# Patient Record
Sex: Male | Born: 1945 | Race: White | Hispanic: No | Marital: Married | Smoking: Former smoker
Health system: Southern US, Community
[De-identification: ages and names within clinical notes are randomized; demographics above are authoritative.]

## PROBLEM LIST (undated history)

## (undated) DIAGNOSIS — C801 Malignant (primary) neoplasm, unspecified: Secondary | ICD-10-CM

## (undated) DIAGNOSIS — E785 Hyperlipidemia, unspecified: Secondary | ICD-10-CM

## (undated) DIAGNOSIS — I639 Cerebral infarction, unspecified: Secondary | ICD-10-CM

## (undated) DIAGNOSIS — Z86010 Personal history of colonic polyps: Secondary | ICD-10-CM

## (undated) HISTORY — DX: Personal history of colonic polyps: Z86.010

## (undated) HISTORY — DX: Cerebral infarction, unspecified: I63.9

## (undated) HISTORY — DX: Hyperlipidemia, unspecified: E78.5

## (undated) HISTORY — PX: ROTATOR CUFF REPAIR: SHX139

## (undated) HISTORY — PX: PROSTATE BIOPSY: SHX241

## (undated) HISTORY — PX: TONSILLECTOMY: SUR1361

---

## 1983-01-06 HISTORY — PX: HERNIA REPAIR: SHX51

## 1995-01-06 HISTORY — PX: APPENDECTOMY: SHX54

## 1995-11-29 ENCOUNTER — Encounter: Payer: Self-pay | Admitting: Internal Medicine

## 2001-01-05 HISTORY — PX: COLONOSCOPY: SHX174

## 2001-03-22 ENCOUNTER — Encounter: Payer: Self-pay | Admitting: Orthopedic Surgery

## 2001-03-22 ENCOUNTER — Ambulatory Visit (HOSPITAL_COMMUNITY): Admission: RE | Admit: 2001-03-22 | Discharge: 2001-03-22 | Payer: Self-pay | Admitting: Orthopedic Surgery

## 2001-09-01 ENCOUNTER — Encounter: Payer: Self-pay | Admitting: Internal Medicine

## 2003-06-22 ENCOUNTER — Encounter: Payer: Self-pay | Admitting: Internal Medicine

## 2003-11-13 ENCOUNTER — Ambulatory Visit: Payer: Self-pay | Admitting: Internal Medicine

## 2004-02-13 ENCOUNTER — Ambulatory Visit: Payer: Self-pay | Admitting: Internal Medicine

## 2004-10-24 ENCOUNTER — Ambulatory Visit: Payer: Self-pay | Admitting: Internal Medicine

## 2005-06-25 ENCOUNTER — Ambulatory Visit: Payer: Self-pay | Admitting: Internal Medicine

## 2005-10-21 ENCOUNTER — Ambulatory Visit: Payer: Self-pay | Admitting: Internal Medicine

## 2005-10-21 LAB — CONVERTED CEMR LAB
ALT: 31 units/L (ref 0–40)
AST: 26 units/L (ref 0–37)
Albumin: 4.2 g/dL (ref 3.5–5.2)
Alkaline Phosphatase: 75 units/L (ref 39–117)
BUN: 17 mg/dL (ref 6–23)
Basophils Absolute: 0 10*3/uL (ref 0.0–0.1)
Basophils Relative: 0.5 % (ref 0.0–1.0)
CO2: 26 meq/L (ref 19–32)
Calcium: 9.6 mg/dL (ref 8.4–10.5)
Chloride: 105 meq/L (ref 96–112)
Chol/HDL Ratio, serum: 5.9
Cholesterol: 260 mg/dL (ref 0–200)
Creatinine, Ser: 1.2 mg/dL (ref 0.4–1.5)
Eosinophil percent: 1.8 % (ref 0.0–5.0)
GFR calc non Af Amer: 65 mL/min
Glomerular Filtration Rate, Af Am: 79 mL/min/{1.73_m2}
Glucose, Bld: 98 mg/dL (ref 70–99)
HCT: 50.9 % (ref 39.0–52.0)
HDL: 44 mg/dL (ref >39.0)
Hemoglobin: 16.9 g/dL (ref 13.0–17.0)
LDL DIRECT: 188.3 mg/dL
Lymphocytes Relative: 27.9 % (ref 12.0–46.0)
MCHC: 33.2 g/dL (ref 30.0–36.0)
MCV: 95 fL (ref 78.0–100.0)
Monocytes Absolute: 0.4 10*3/uL (ref 0.2–0.7)
Monocytes Relative: 7 % (ref 3.0–11.0)
Neutro Abs: 3.5 10*3/uL (ref 1.4–7.7)
Neutrophils Relative %: 62.8 % (ref 43.0–77.0)
PSA: 1.28 ng/mL (ref 0.10–4.00)
Platelets: 231 10*3/uL (ref 150–400)
Potassium: 4 meq/L (ref 3.5–5.1)
RBC: 5.36 M/uL (ref 4.22–5.81)
RDW: 12.2 % (ref 11.5–14.6)
Sodium: 140 meq/L (ref 135–145)
TSH: 2.06 microintl units/mL (ref 0.35–5.50)
Total Bilirubin: 0.9 mg/dL (ref 0.3–1.2)
Total Protein: 7 g/dL (ref 6.0–8.3)
Triglyceride fasting, serum: 106 mg/dL (ref 0–149)
VLDL: 21 mg/dL (ref 0–40)
WBC: 5.6 10*3/uL (ref 4.5–10.5)

## 2006-01-19 ENCOUNTER — Ambulatory Visit: Payer: Self-pay | Admitting: Sports Medicine

## 2006-03-02 ENCOUNTER — Ambulatory Visit: Payer: Self-pay | Admitting: Sports Medicine

## 2006-04-02 ENCOUNTER — Ambulatory Visit: Payer: Self-pay | Admitting: Internal Medicine

## 2006-04-02 LAB — CONVERTED CEMR LAB
ALT: 41 U/L — ABNORMAL HIGH (ref 0–40)
AST: 32 U/L (ref 0–37)
Cholesterol: 129 mg/dL (ref 0–200)
HDL: 41.7 mg/dL (ref >39.0)
LDL Cholesterol: 68 mg/dL (ref 0–99)
Total CHOL/HDL Ratio: 3.1
Triglycerides: 99 mg/dL (ref 0–149)
VLDL: 20 mg/dL (ref 0–40)

## 2006-05-06 DIAGNOSIS — Z9089 Acquired absence of other organs: Secondary | ICD-10-CM | POA: Insufficient documentation

## 2006-05-06 DIAGNOSIS — E785 Hyperlipidemia, unspecified: Secondary | ICD-10-CM | POA: Insufficient documentation

## 2006-05-07 ENCOUNTER — Ambulatory Visit: Payer: Self-pay | Admitting: Internal Medicine

## 2006-09-21 ENCOUNTER — Encounter: Payer: Self-pay | Admitting: Internal Medicine

## 2006-10-29 ENCOUNTER — Ambulatory Visit: Payer: Self-pay | Admitting: Internal Medicine

## 2006-11-09 ENCOUNTER — Encounter (INDEPENDENT_AMBULATORY_CARE_PROVIDER_SITE_OTHER): Payer: Self-pay | Admitting: *Deleted

## 2006-11-09 LAB — CONVERTED CEMR LAB
ALT: 25 units/L (ref 0–53)
AST: 23 units/L (ref 0–37)
Cholesterol: 177 mg/dL (ref 0–200)
HDL: 48.8 mg/dL (ref >39.0)
Hgb A1c MFr Bld: 5.2 % (ref 4.6–6.0)
LDL Cholesterol: 105 mg/dL — ABNORMAL HIGH (ref 0–99)
Total CHOL/HDL Ratio: 3.6
Triglycerides: 117 mg/dL (ref 0–149)
VLDL: 23 mg/dL (ref 0–40)

## 2006-11-25 ENCOUNTER — Ambulatory Visit: Payer: Self-pay | Admitting: Internal Medicine

## 2007-01-18 ENCOUNTER — Ambulatory Visit: Payer: Self-pay | Admitting: Internal Medicine

## 2007-04-12 ENCOUNTER — Telehealth (INDEPENDENT_AMBULATORY_CARE_PROVIDER_SITE_OTHER): Payer: Self-pay | Admitting: *Deleted

## 2007-04-18 ENCOUNTER — Encounter: Payer: Self-pay | Admitting: Internal Medicine

## 2007-09-16 ENCOUNTER — Ambulatory Visit: Payer: Self-pay | Admitting: Internal Medicine

## 2007-09-19 ENCOUNTER — Encounter (INDEPENDENT_AMBULATORY_CARE_PROVIDER_SITE_OTHER): Payer: Self-pay | Admitting: *Deleted

## 2008-01-18 ENCOUNTER — Telehealth (INDEPENDENT_AMBULATORY_CARE_PROVIDER_SITE_OTHER): Payer: Self-pay | Admitting: *Deleted

## 2008-02-24 ENCOUNTER — Telehealth (INDEPENDENT_AMBULATORY_CARE_PROVIDER_SITE_OTHER): Payer: Self-pay | Admitting: *Deleted

## 2008-11-05 ENCOUNTER — Encounter: Payer: Self-pay | Admitting: Internal Medicine

## 2008-12-05 ENCOUNTER — Telehealth (INDEPENDENT_AMBULATORY_CARE_PROVIDER_SITE_OTHER): Payer: Self-pay | Admitting: *Deleted

## 2008-12-05 ENCOUNTER — Ambulatory Visit: Payer: Self-pay | Admitting: Internal Medicine

## 2008-12-06 ENCOUNTER — Ambulatory Visit: Payer: Self-pay | Admitting: Internal Medicine

## 2008-12-07 ENCOUNTER — Telehealth: Payer: Self-pay | Admitting: Internal Medicine

## 2008-12-09 LAB — CONVERTED CEMR LAB
ALT: 24 units/L (ref 0–53)
AST: 24 units/L (ref 0–37)
Albumin: 4 g/dL (ref 3.5–5.2)
Alkaline Phosphatase: 91 units/L (ref 39–117)
BUN: 18 mg/dL (ref 6–23)
Basophils Absolute: 0.1 10*3/uL (ref 0.0–0.1)
Basophils Relative: 0.9 % (ref 0.0–3.0)
Bilirubin, Direct: 0.1 mg/dL (ref 0.0–0.3)
CO2: 27 meq/L (ref 19–32)
Calcium: 9.1 mg/dL (ref 8.4–10.5)
Chloride: 107 meq/L (ref 96–112)
Cholesterol: 164 mg/dL (ref 0–200)
Creatinine, Ser: 1.1 mg/dL (ref 0.4–1.5)
Eosinophils Absolute: 0.1 10*3/uL (ref 0.0–0.7)
Eosinophils Relative: 1.9 % (ref 0.0–5.0)
GFR calc non Af Amer: 71.23 mL/min (ref >60)
Glucose, Bld: 97 mg/dL (ref 70–99)
HCT: 46.2 % (ref 39.0–52.0)
HDL: 46.3 mg/dL (ref >39.00)
Hemoglobin: 16.1 g/dL (ref 13.0–17.0)
LDL Cholesterol: 100 mg/dL — ABNORMAL HIGH (ref 0–99)
Lymphocytes Relative: 27.2 % (ref 12.0–46.0)
Lymphs Abs: 1.7 10*3/uL (ref 0.7–4.0)
MCHC: 34.8 g/dL (ref 30.0–36.0)
MCV: 96.8 fL (ref 78.0–100.0)
Monocytes Absolute: 0.4 10*3/uL (ref 0.1–1.0)
Monocytes Relative: 6.4 % (ref 3.0–12.0)
Neutro Abs: 4.1 10*3/uL (ref 1.4–7.7)
Neutrophils Relative %: 63.6 % (ref 43.0–77.0)
PSA: 1.42 ng/mL (ref 0.10–4.00)
Platelets: 174 10*3/uL (ref 150.0–400.0)
Potassium: 4.6 meq/L (ref 3.5–5.1)
RBC: 4.77 M/uL (ref 4.22–5.81)
RDW: 11.8 % (ref 11.5–14.6)
Sodium: 140 meq/L (ref 135–145)
TSH: 1.87 microintl units/mL (ref 0.35–5.50)
Total Bilirubin: 0.7 mg/dL (ref 0.3–1.2)
Total CHOL/HDL Ratio: 4
Total Protein: 6.8 g/dL (ref 6.0–8.3)
Triglycerides: 90 mg/dL (ref 0.0–149.0)
VLDL: 18 mg/dL (ref 0.0–40.0)
WBC: 6.4 10*3/uL (ref 4.5–10.5)

## 2008-12-10 ENCOUNTER — Encounter (INDEPENDENT_AMBULATORY_CARE_PROVIDER_SITE_OTHER): Payer: Self-pay | Admitting: *Deleted

## 2009-04-10 ENCOUNTER — Ambulatory Visit: Payer: Self-pay | Admitting: Internal Medicine

## 2009-04-11 ENCOUNTER — Emergency Department (HOSPITAL_COMMUNITY): Admission: EM | Admit: 2009-04-11 | Discharge: 2009-04-11 | Payer: Self-pay | Admitting: Emergency Medicine

## 2009-04-11 ENCOUNTER — Telehealth: Payer: Self-pay | Admitting: Internal Medicine

## 2009-04-11 ENCOUNTER — Ambulatory Visit: Payer: Self-pay | Admitting: Internal Medicine

## 2009-04-12 ENCOUNTER — Encounter: Payer: Self-pay | Admitting: Internal Medicine

## 2009-04-12 ENCOUNTER — Encounter: Admission: RE | Admit: 2009-04-12 | Discharge: 2009-04-12 | Payer: Self-pay | Admitting: Internal Medicine

## 2009-04-12 LAB — CONVERTED CEMR LAB
ALT: 24 units/L (ref 0–53)
AST: 23 units/L (ref 0–37)
Albumin: 4.1 g/dL (ref 3.5–5.2)
Alkaline Phosphatase: 73 units/L (ref 39–117)
Amylase: 65 units/L (ref 27–131)
Basophils Absolute: 0 10*3/uL (ref 0.0–0.1)
Basophils Relative: 0.5 % (ref 0.0–3.0)
Bilirubin, Direct: 0.1 mg/dL (ref 0.0–0.3)
Eosinophils Absolute: 0.1 10*3/uL (ref 0.0–0.7)
Eosinophils Relative: 1.4 % (ref 0.0–5.0)
HCT: 47 % (ref 39.0–52.0)
Hemoglobin: 16.1 g/dL (ref 13.0–17.0)
Lipase: 20 units/L (ref 11.0–59.0)
Lymphocytes Relative: 26.3 % (ref 12.0–46.0)
Lymphs Abs: 2.3 10*3/uL (ref 0.7–4.0)
MCHC: 34.2 g/dL (ref 30.0–36.0)
MCV: 96.1 fL (ref 78.0–100.0)
Monocytes Absolute: 0.7 10*3/uL (ref 0.1–1.0)
Monocytes Relative: 7.6 % (ref 3.0–12.0)
Neutro Abs: 5.7 10*3/uL (ref 1.4–7.7)
Neutrophils Relative %: 64.2 % (ref 43.0–77.0)
Platelets: 196 10*3/uL (ref 150.0–400.0)
RBC: 4.9 M/uL (ref 4.22–5.81)
RDW: 13.1 % (ref 11.5–14.6)
Total Bilirubin: 0.9 mg/dL (ref 0.3–1.2)
Total Protein: 7.2 g/dL (ref 6.0–8.3)
WBC: 8.8 10*3/uL (ref 4.5–10.5)

## 2009-04-15 ENCOUNTER — Telehealth (INDEPENDENT_AMBULATORY_CARE_PROVIDER_SITE_OTHER): Payer: Self-pay | Admitting: *Deleted

## 2009-04-15 ENCOUNTER — Encounter: Payer: Self-pay | Admitting: Internal Medicine

## 2009-04-16 ENCOUNTER — Telehealth: Payer: Self-pay | Admitting: Internal Medicine

## 2009-04-30 ENCOUNTER — Ambulatory Visit: Payer: Self-pay | Admitting: Internal Medicine

## 2009-04-30 LAB — CONVERTED CEMR LAB
OCCULT 1: NEGATIVE
OCCULT 2: NEGATIVE
OCCULT 3: NEGATIVE

## 2009-05-01 ENCOUNTER — Encounter (INDEPENDENT_AMBULATORY_CARE_PROVIDER_SITE_OTHER): Payer: Self-pay | Admitting: *Deleted

## 2009-05-20 ENCOUNTER — Ambulatory Visit: Payer: Self-pay | Admitting: Internal Medicine

## 2010-01-31 ENCOUNTER — Ambulatory Visit
Admission: RE | Admit: 2010-01-31 | Discharge: 2010-01-31 | Payer: Self-pay | Source: Home / Self Care | Attending: Internal Medicine | Admitting: Internal Medicine

## 2010-01-31 ENCOUNTER — Encounter: Payer: Self-pay | Admitting: Internal Medicine

## 2010-02-02 LAB — CONVERTED CEMR LAB
ALT: 26 units/L (ref 0–53)
AST: 25 units/L (ref 0–37)
Albumin: 4.5 g/dL (ref 3.5–5.2)
Alkaline Phosphatase: 77 units/L (ref 39–117)
BUN: 21 mg/dL (ref 6–23)
Basophils Absolute: 0 10*3/uL (ref 0.0–0.1)
Basophils Relative: 0.4 % (ref 0.0–3.0)
Bilirubin, Direct: 0.1 mg/dL (ref 0.0–0.3)
CO2: 28 meq/L (ref 19–32)
Calcium: 9.4 mg/dL (ref 8.4–10.5)
Chloride: 107 meq/L (ref 96–112)
Cholesterol, target level: 200 mg/dL
Cholesterol: 214 mg/dL (ref 0–200)
Creatinine, Ser: 1.1 mg/dL (ref 0.4–1.5)
Direct LDL: 160.9 mg/dL
Eosinophils Absolute: 0.1 10*3/uL (ref 0.0–0.7)
Eosinophils Relative: 1 % (ref 0.0–5.0)
GFR calc Af Amer: 87 mL/min
GFR calc non Af Amer: 72 mL/min
Glucose, Bld: 93 mg/dL (ref 70–99)
HCT: 48.9 % (ref 39.0–52.0)
HDL goal, serum: 40 mg/dL
HDL: 36.8 mg/dL — ABNORMAL LOW (ref >39.0)
Hemoglobin: 17.3 g/dL — ABNORMAL HIGH (ref 13.0–17.0)
LDL Goal: 120 mg/dL
Lymphocytes Relative: 22.2 % (ref 12.0–46.0)
MCHC: 35.3 g/dL (ref 30.0–36.0)
MCV: 95.5 fL (ref 78.0–100.0)
Monocytes Absolute: 0.5 10*3/uL (ref 0.1–1.0)
Monocytes Relative: 6.2 % (ref 3.0–12.0)
Neutro Abs: 5.5 10*3/uL (ref 1.4–7.7)
Neutrophils Relative %: 70.2 % (ref 43.0–77.0)
PSA: 1.36 ng/mL (ref 0.10–4.00)
Platelets: 218 10*3/uL (ref 150–400)
Potassium: 4.6 meq/L (ref 3.5–5.1)
RBC: 5.12 M/uL (ref 4.22–5.81)
RDW: 12 % (ref 11.5–14.6)
Sodium: 141 meq/L (ref 135–145)
TSH: 1.97 microintl units/mL (ref 0.35–5.50)
Total Bilirubin: 1 mg/dL (ref 0.3–1.2)
Total CHOL/HDL Ratio: 5.8
Total Protein: 8 g/dL (ref 6.0–8.3)
Triglycerides: 137 mg/dL (ref 0–149)
VLDL: 27 mg/dL (ref 0–40)
WBC: 7.8 10*3/uL (ref 4.5–10.5)

## 2010-02-04 NOTE — Progress Notes (Signed)
Summary: Triage   Phone Note From Other Clinic   Caller: Renee @ [facility] [phone] Call For: Dr. Leone Payor Summary of Call: pt is having upper right quadrant pain.  Had HIDA Scan and abd. ultrasound that was normal. Would like to be seen before next avail. appt.  Patient will leave this Thursday and will return next Wed Initial call taken by: Karna Christmas,  April 16, 2009 3:47 PM  Follow-up for Phone Call        I spoke with GJ they will call me back after they get more symptom update from the patient  Darcey Nora RN, [facility]  April 17, 2009 9:31 AM  05/20/09 9:30 with Dr Leone Payor I have left a message for GJ to call me back. Darcey Nora RN, [facility]  April 17, 2009 9:59 AM  Additional Follow-up for Phone Call Additional follow up Details #1::        Stacia aware, she will notify the pt Additional Follow-up by: Darcey Nora RN, CGRN,  April 17, 2009 10:18 AM     Appended Document: Triage Spoke with patient's wife, she is aware of appointment date/time with Dr.Gessner

## 2010-02-04 NOTE — Letter (Signed)
Summary: Results Follow up Letter  [name] [place]  [address] [place], [place] [address]   Phone: [phone]  Fax: [phone]    05/01/2009 MRN: [id number]  David Allen [address] [place], [place]  [address]  Dear [name],  The following are the results of your recent test(s):  Test         Result    Pap Smear:        Normal _____  Not Normal _____ Comments: ______________________________________________________ Cholesterol: LDL(Bad cholesterol):         Your goal is less than:         HDL (Good cholesterol):       Your goal is more than: Comments:  ______________________________________________________ Mammogram:        Normal _____  Not Normal _____ Comments:  ___________________________________________________________________ Hemoccult:        Normal _X____  Not normal _______ Comments:    _____________________________________________________________________ Other Tests:    We routinely do not discuss normal results over the telephone.  If you desire a copy of the results, or you have any questions about this information we can discuss them at your next office visit.   Sincerely,

## 2010-02-04 NOTE — Progress Notes (Signed)
Summary: Request for HIDA Scan Results  Phone Note Outgoing Call   Call placed by: Shonna Chock,  April 15, 2009 5:17 PM Call placed to: Patient Summary of Call: I called the number for [facility] [phone] (No Answer) to request results per patient request in Email sent to Dr.Hopper  [facility]  April 15, 2009 5:17 PM   Follow-up for Phone Call        I called [facility] again and they said the report is avaliable and they will fax that to [place].   Report will be placed on ledge for Dr.Hopper to review. Follow-up by: Shonna Chock,  April 16, 2009 10:54 AM  Additional Follow-up for Phone Call Additional follow up Details #1::        Papida scan ( assesses gall bladder function) is normal. GI consultation indicated Additional Follow-up by: Shonna Chock,  April 16, 2009 11:24 AM    Additional Follow-up for Phone Call Additional follow up Details #2::    Spoke with patient's wife and she is aware of GI referral.  Patient will leave this Thursday and will return next Wed. Please take this in consdieration when setting up referral.  Patient's wife has several question about the scan and would like for Dr.Hopper to call to discuss. Shonna Chock,  April 16, 2009 11:25 AM  I have placed a triage call to [facility], awaiting call back regarding patient's appointment. Magdalen Spatz [facility]  April 16, 2009 3:53 PM  Patient was given appointment w/Dr. Leone Payor on 05-20-2009 @ 9:30am.  See [facility] phone note dated 04/16/2009.  Patient aware of appt.  Follow-up by: Magdalen Spatz [facility],  April 17, 2009 3:45 PM  Additional Follow-up for Phone Call Additional follow up Details #3:: Details for Additional Follow-up Action Taken: please send stool cards to his home   Stool Cards Mailed./[facility]  April 17, 2009 4:49 PM  Additional Follow-up by: Marga Melnick MD,  April 17, 2009 4:19 PM

## 2010-02-04 NOTE — Procedures (Signed)
Summary: Colonoscopy: Hemorrhoids   Colonoscopy  Procedure date:  09/01/2001  Findings:      Results: Hemorrhoids.     Location:  [facility].    Procedures Next Due Date:    Colonoscopy: 09/2011  Patient Name: David Allen, David Allen MRN:  Procedure Procedures: Colonoscopy CPT: 16109.  Personnel: Endoscopist: Iva Boop, MD, [facility].  Referred By: Titus Dubin. Alwyn Ren, MD.  Exam Location: Exam performed in Outpatient Clinic. Outpatient  Patient Consent: Procedure, Alternatives, Risks and Benefits discussed, consent obtained, from patient. Consent was obtained by the RN.  Indications  Average Risk Screening Routine.  History  Pre-Exam Physical: Performed Sep 01, 2001. Cardio-pulmonary exam, Rectal exam, HEENT exam , Mental status exam WNL.  Exam Exam: Extent of exam reached: Cecum, extent intended: Cecum.  The cecum was identified by appendiceal orifice and IC valve. Patient position: on left side. Colon retroflexion performed. Images taken. ASA Classification: I. Tolerance: excellent.  Monitoring: Pulse and BP monitoring, Oximetry used. Supplemental O2 given.  Colon Prep Used Visicol for colon prep. Prep results: excellent.  Sedation Meds: Patient assessed and found to be appropriate for moderate (conscious) sedation. Fentanyl 100 mcg. given IV. Versed 7 mg. given IV.  Findings - NORMAL EXAM: Cecum to Sigmoid Colon.  NORMAL EXAM: Cecum.  HEMORRHOIDS: External. Size: Grade I. Not bleeding. Not thrombosed. ICD9: Hemorrhoids, External: 455.3.   Assessment Abnormal examination, see findings above.  Diagnoses: 455.3: Hemorrhoids, External.   Events  Unplanned Interventions: No intervention was required.  Plans Patient Education: Patient given standard instructions for: Hemorrhoids.  Disposition: After procedure patient sent to recovery. After recovery patient sent home.  Comments: Recommend starting screening hemoccults in 5 years (through  Dr. Alwyn Ren) and routine colonoscopy in 10 years (2013).  CC:   Marga Melnick, MD  This report was created from the original endoscopy report, which was reviewed and signed by the above listed endoscopist.

## 2010-02-04 NOTE — Miscellaneous (Signed)
Summary: Orders Update   Clinical Lists Changes  Orders: Added new Referral order of Radiology Referral (Radiology) - Signed 

## 2010-02-04 NOTE — Assessment & Plan Note (Signed)
Summary: abdominal pain/cbs per chrae   Vital Signs:  Patient profile:   65 year old male Weight:      212.2 pounds Temp:     98.9 degrees F oral Pulse rate:   80 / minute Resp:     17 per minute BP sitting:   148 / 80  (left arm) Cuff size:   large  Vitals Entered By: Shonna Chock (April 10, 2009 4:37 PM) CC: Right side stomach pain since 1 pm, (appendix removed), Abdominal pain Comments REVIEWED MED LIST, PATIENT AGREED DOSE AND INSTRUCTION CORRECT    CC:  Right side stomach pain since 1 pm, (appendix removed), and Abdominal pain.  History of Present Illness:  Abdominal Pain      This is a 65 year old man who presents with acute abdominal pain immediately after eating egg plant parmesan today @ 1:30 pm .  The patient reports nausea and vomiting which he "forced"after taking TUMS.He  denies diarrhea, constipation, melena, hematochezia, anorexia, and hematemesis.  The location of the pain is right upper quadrant.  The pain is described as constant and sharp w/o radiation.It has decreased from 9 to a 5 presently.  The patient denies the following symptoms: fever, dysuria, chest pain, jaundice, and dark urine.  The pain was  worse with food.  The pain was no  better with antacids; standing & walking helped some. PMH of appendectomy.  Allergies (verified): No Known Drug Allergies  Past History:  Past Surgical History: Reviewed history from 12/05/2008 and no changes required. Appendectomy 1997 ([place]) Colonoscopy 2003: neg (due 2013), [facility] Rotator cuff repair,Dr Sypher Tonsillectomy  Review of Systems General:  Denies chills and sweats. ENT:  Denies difficulty swallowing and hoarseness. CV:  No exertional chewst pain. GI:  Denies indigestion. GU:  Denies discharge and hematuria.  Physical Exam  General:  well-nourished,in no acute distress; alert,appropriate and cooperative throughout examination Eyes:  No corneal or conjunctival inflammation noted. Perrla.No  icterus Mouth:  Oral mucosa and oropharynx without lesions or exudates.  Teeth in good repair. no pharyngeal erythema.  Tongue moist Lungs:  Normal respiratory effort, chest expands symmetrically. Lungs are clear to auscultation, no crackles or wheezes. Heart:  Normal rate and regular rhythm. S1 and S2 normal without gallop, murmur, click, rub .S4 Abdomen:  Bowel sounds positive but decreased w/o clinical ileus.Abdomen is  soft and non-tender  even to percussion ,without masses, organomegaly .Ventral hernia Skin:  Intact without suspicious lesions or rashes. No jaundice Cervical Nodes:  No lymphadenopathy noted Axillary Nodes:  No palpable lymphadenopathy Psych:  memory intact for recent and remote, normally interactive, and good eye contact.     Impression & Recommendations:  Problem # 1:  ABDOMINAL PAIN, RIGHT UPPER QUADRANT (ICD-789.01)  R/O GB disease  Orders: Radiology Referral (Radiology)  Complete Medication List: 1)  Crestor 10 Mg Tabs (Rosuvastatin calcium) .... Take 1 tab once daily at bedtime 2)  Tramadol Hcl 50 Mg Tabs (Tramadol hcl) .Marland Kitchen.. 1 every 6 hrs as needed for pain  Patient Instructions: 1)  Nothing by mouth except ice chips  & meds.To ER if pain recurs & progresses.Schedule fasting labs in am: 2)  Hepatic Panel ;amylase;lipase; 3)  CBC w/ Diff . Hold Crestor until well. Prescriptions: TRAMADOL HCL 50 MG TABS (TRAMADOL HCL) 1 every 6 hrs as needed for pain  #30 x 1   Entered and Authorized by:   Marga Melnick MD   Signed by:   Marga Melnick MD on  04/10/2009   Method used:   Faxed to ...       [facility] [address] [phone]* (retail)       [address]       [place], [place]  [address]       Ph: [phone] or [phone]       Fax: [phone]   RxID:   [id number]

## 2010-02-04 NOTE — Assessment & Plan Note (Signed)
Summary: ruq abdominal pain/sheri    History of Present Illness Visit Type: consult Primary GI MD: Stan Head MD [facility] Primary Provider: Oren Bracket Requesting Provider: Oren Bracket Chief Complaint: RUQ Pain History of Present Illness:   65 yo wm with sudden onset of RUQ pain in Apri Dr. Alwyn Ren thought it sounded like  gallbladder pain.  Pt stated Epigastric pain in the center, pt states he is in no pain now, only here to discuss HIDA scan done at [facility] Only had two days of pain.It started immediately after eggplant parmasean. Lasted two days, persistent then spontaneously resolved. He saw Dr. Alwyn Ren, the ED and had labs and xrays. Pain was fairly constant and in epigastrium he tells me.  Dr. Frederik Pear hx of 04/10/2009....Marland Kitchen  Abdominal Pain      This is a 65 year old man who presents with acute abdominal pain immediately after eating egg plant parmesan today @ 1:30 pm .  The patient reports nausea and vomiting which he "forced"after taking TUMS.He  denies diarrhea, constipation, melena, hematochezia, anorexia, and hematemesis.  The location of the pain is right upper quadrant.  The pain is described as constant and sharp w/o radiation.It has decreased from 9 to a 5 presently.  The patient denies the following symptoms: fever, dysuria, chest pain, jaundice, and dark urine.  The pain was  worse with food.  The pain was no  better with antacids; standing & walking helped some. PMH of appendectomy.   GI Review of Systems    Reports abdominal pain.     Location of  Abdominal pain: RUQ.    Denies acid reflux, belching, bloating, chest pain, dysphagia with liquids, dysphagia with solids, heartburn, loss of appetite, nausea, vomiting, vomiting blood, weight loss, and  weight gain.        Denies anal fissure, black tarry stools, change in bowel habit, constipation, diarrhea, diverticulosis, fecal incontinence, heme positive stool, hemorrhoids, irritable bowel syndrome,  jaundice, light color stool, liver problems, rectal bleeding, and  rectal pain.    [place] of Abdomen  Procedure date:  04/12/2009  Findings:      normal:    HIDA Scan  Procedure date:  04/15/2009  Findings:      Normal uptake and excretion ejection fraction 41% 'low normal'   Current Medications (verified): 1)  Crestor 10 Mg Tabs (Rosuvastatin Calcium) .... Take 1 Tab Once Daily At Bedtime 2)  Aspirin 81 Mg Tbec (Aspirin) .Marland Kitchen.. 1 By Mouth Once Daily  Allergies (verified): No Known Drug Allergies  Past History:  Past Medical History: Reviewed history from 05/17/2009 and no changes required. Hyperlipidemia; Elevated hepatic enzymes ? 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 Father:lung CA  Mother: Dementia Siblings: bro neg; MGM Dementia; cousin double mastectomy  Social History: Occupation:Stock Teaching laboratory technician Married Alcohol use-yes:socially Regular exercise-yes: aerobic, toning , tennis Former Smoker: smoked 1970-79  Review of Systems       no chest pain, DOE exercises vigorously several times a week no fevers  Vital Signs:  Patient profile:   65 year old male Height:      69.5 inches Weight:      212 pounds BMI:     30.97 BSA:     2.13 Pulse rate:   88 / minute  Pulse rhythm:   regular BP sitting:   142 / 78  (left arm)  Vitals Entered By: Merri Ray CMA Duncan Dull) (May 20, 2009 9:20 AM)  Physical Exam  General:  Well developed, well nourished, no acute distress. Overweight Lungs:  Clear throughout to auscultation. Heart:  Regular rate and rhythm; no murmurs, rubs,  or bruits. Abdomen:  Bowel sounds positive but decreased w/o clinical ileus.Abdomen is  soft and non-tender  even to  percussion ,without masses, organomegaly .Ventral hernia overweight Psych:  Alert and cooperative. Normal mood and affect.   Impression & Recommendations:  Problem # 1:  ABDOMINAL PAIN, UPPER (ICD-789.09) Assessment Comment Only New for GI evaluation orig RUQ,  now described as  epigastric occurred acutely after a meal (eggplant) it is resolved and HIDA, US abdomen unrevealing acute gastritis possible, passage of gallstone/CBD stone possible he is fine now and no worrisome features, labs ok also will observe for future problems and go from there  Patient Instructions: 1)  Call us back if the abdominal pain recurs. 2)  Copy sent to : Marga Melnick, MD 3)  The medication list was reviewed and reconciled.  All changed / newly prescribed medications were explained.  A complete medication list was provided to the patient / caregiver.

## 2010-02-04 NOTE — Progress Notes (Signed)
Summary: Phone-stomach pain//FYI REC ED/ PT WANTS A CALL FROM [facility]  Phone Note Call from Patient Call back at Home Phone [phone]   Caller: Spouse Summary of Call: Patient was seen this week. Patient is still having stomach pain. Patient wife stated it just started an hour ago. Patient wife Lexus Barletta is requesting that Dr. Alwyn Ren call her. Advise patient that Dr. Alwyn Ren is still seeing patients. I will give him the message. Please advise.  Initial call taken by: Barb Merino,  April 11, 2009 4:34 PM  Follow-up for Phone Call        Spoke with pt wife who says pt stomach started again today was seen yesterday. Recommend pt to ED, wife agreed, BUT WANTS [facility] TO CALL HER.  SHE IS AWARE [facility] IS STILL SEEING PATIENTS .Kandice Hams  April 11, 2009 4:51 PM  Follow-up by: Kandice Hams,  April 11, 2009 4:51 PM  Additional Follow-up for Phone Call Additional follow up Details #1::        Message left on VM: Patient called again stating she wishes Dr.Hopper will call her  Additional Follow-up by: Shonna Chock,  April 11, 2009 5:00 PM    Additional Follow-up for Phone Call Additional follow up Details #2::    I called & left message on Voice Mail with labs. i recommended Prilosec OTC every 12 hrs & clear liquids Follow-up by: Marga Melnick MD,  April 11, 2009 6:19 PM

## 2010-02-06 ENCOUNTER — Other Ambulatory Visit (INDEPENDENT_AMBULATORY_CARE_PROVIDER_SITE_OTHER): Payer: BC Managed Care – PPO

## 2010-02-06 ENCOUNTER — Other Ambulatory Visit: Payer: Self-pay | Admitting: Internal Medicine

## 2010-02-06 ENCOUNTER — Encounter (INDEPENDENT_AMBULATORY_CARE_PROVIDER_SITE_OTHER): Payer: Self-pay | Admitting: *Deleted

## 2010-02-06 DIAGNOSIS — Z0000 Encounter for general adult medical examination without abnormal findings: Secondary | ICD-10-CM

## 2010-02-06 DIAGNOSIS — Z23 Encounter for immunization: Secondary | ICD-10-CM

## 2010-02-06 LAB — CBC WITH DIFFERENTIAL/PLATELET
Basophils Absolute: 0 10*3/uL (ref 0.0–0.1)
Eosinophils Absolute: 0.1 10*3/uL (ref 0.0–0.7)
Hemoglobin: 16.1 g/dL (ref 13.0–17.0)
Lymphocytes Relative: 26.7 % (ref 12.0–46.0)
Lymphs Abs: 1.9 10*3/uL (ref 0.7–4.0)
MCHC: 34.7 g/dL (ref 30.0–36.0)
Neutro Abs: 4.7 10*3/uL (ref 1.4–7.7)
Platelets: 200 10*3/uL (ref 150.0–400.0)
RDW: 12.6 % (ref 11.5–14.6)

## 2010-02-06 LAB — LIPID PANEL
Cholesterol: 139 mg/dL (ref 0–200)
HDL: 46.3 mg/dL (ref >39.00)
LDL Cholesterol: 74 mg/dL (ref 0–99)
Total CHOL/HDL Ratio: 3
Triglycerides: 93 mg/dL (ref 0.0–149.0)

## 2010-02-06 LAB — BASIC METABOLIC PANEL
BUN: 19 mg/dL (ref 6–23)
Calcium: 9 mg/dL (ref 8.4–10.5)
Creatinine, Ser: 1.1 mg/dL (ref 0.4–1.5)
GFR: 71.72 mL/min (ref >60.00)

## 2010-02-06 LAB — HEPATIC FUNCTION PANEL
ALT: 20 U/L (ref 0–53)
Bilirubin, Direct: 0.1 mg/dL (ref 0.0–0.3)
Total Bilirubin: 0.6 mg/dL (ref 0.3–1.2)

## 2010-02-06 LAB — TSH: TSH: 2.17 u[IU]/mL (ref 0.35–5.50)

## 2010-02-06 LAB — PSA: PSA: 1.68 ng/mL (ref 0.10–4.00)

## 2010-02-06 NOTE — Assessment & Plan Note (Signed)
Summary: CPX//PH   Vital Signs:  Patient profile:   65 year old male Height:      70.75 inches Weight:      211.8 pounds BMI:     29.86 Temp:     98.8 degrees F oral Pulse rate:   76 / minute Resp:     16 per minute BP sitting:   128 / 82  (left arm) Cuff size:   large  Vitals Entered By: Shonna Chock CMA (January 31, 2010 11:31 AM)  CC: CPX: not fasting , General Medical Evaluation, Lipid Management Comments Patient states Vaccines UTD  Vision Screening:Left eye with correction: 20 / 30 Right eye with correction: 20 / 30 Both eyes with correction: 20 / 25        Vision Entered By: Shonna Chock CMA (January 31, 2010 11:44 AM)   Primary Care Provider:  Oren Bracket  CC:  CPX: not fasting , General Medical Evaluation, and Lipid Management.  History of Present Illness:    David Allen is here for a physical; he is asymptomatic. He is not on Medicare as primary .    Hyperlipidemia Follow-Up: The patient denies muscle aches, GI upset, abdominal pain, flushing, itching, constipation, diarrhea, and fatigue.  The patient denies the following symptoms: chest pain/pressure, exercise intolerance, dypsnea, palpitations, syncope, and pedal edema.  Compliance with medications (by patient report) has been near 100%.  Dietary compliance has been fair.  Adjunctive measures currently used by the patient include fiber.    Lipid Management History:      Positive NCEP/ATP III risk factors include male age 26 years old or older.  Negative NCEP/ATP III risk factors include non-diabetic, no family history for ischemic heart disease, non-tobacco-user status, non-hypertensive, no ASHD (atherosclerotic heart disease), no prior stroke/TIA, no peripheral vascular disease, and no history of aortic aneurysm.     Allergies (verified): No Known Drug Allergies  Past History:  Past Medical History: Hyperlipidemia: NMR Lipoprofile 2005: LDL 829(5621/308), HDL 40, TG 126. LDL goal = < 120. [place]  Study LDL goal = < 160. Elevated hepatic enzymes ? 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) ; LAD on EKG  Past Surgical History: Appendectomy 1997 ([place]) Colonoscopy 2003: negative  (due 2013), [facility] Rotator cuff repair,Dr Sypher Tonsillectomy Hernia Surgery  1985  Family History: Father:lung cancer Mother: Dementia Siblings: bro :negative ; MGM :Dementia; M  cousin: double mastectomy  Social History: Occupation:Stock Broker/Finaicial Advisor Married Alcohol use-yes:socially Regular exercise-yes: aerobic, toning , tennis 3- 4 X/week Former Smoker: smoked 1968-78  Review of Systems  The patient denies anorexia, fever, vision loss, decreased hearing, hoarseness, prolonged cough, hemoptysis, melena, hematochezia, severe indigestion/heartburn, hematuria, depression, unusual weight change, abnormal bleeding, enlarged lymph nodes, and angioedema.         Weight "creeping up".  Physical Exam  General:  well-nourished;alert,appropriate and cooperative throughout examination Head:  Normocephalic and atraumatic without obvious abnormalities.  Pattern  alopecia Eyes:  No corneal or conjunctival inflammation noted. Perrla. Funduscopic exam benign, without hemorrhages, exudates or papilledema.  Ears:  External ear exam shows no significant lesions or deformities.  Otoscopic examination reveals clear canals, tympanic membranes are intact bilaterally without bulging, retraction, inflammation or discharge. Hearing is grossly normal bilaterally. Nose:  External nasal examination shows no deformity or inflammation. Nasal mucosa are pink and moist without lesions or exudates. Mouth:  Oral mucosa and oropharynx without lesions or exudates.  Teeth in good repair. Neck:  No deformities, masses, or tenderness noted. Lungs:  Normal respiratory  effort, chest expands symmetrically. Lungs are clear to auscultation, no crackles or wheezes. Heart:  Normal rate and regular rhythm. S1 and S2 normal without gallop,  murmur, click, rub .S4 Abdomen:  Bowel sounds positive,abdomen soft and non-tender without masses, organomegaly or hernias noted. Rectal:  No external abnormalities noted. Normal sphincter tone. No rectal masses or tenderness. Genitalia:  Testes bilaterally descended without nodularity, tenderness or masses. No scrotal masses or lesions. No penis lesions or urethral discharge. L varicocele.   Prostate:  Prostate gland  small,firm and smooth, no enlargement, nodularity, tenderness, mass, asymmetry or induration. Msk:  No deformity or scoliosis noted of thoracic or lumbar spine.   Pulses:  R and L carotid,radial,dorsalis pedis and posterior tibial pulses are full and equal bilaterally Extremities:  No clubbing, cyanosis, edema, or deformity noted with normal full range of motion of all joints.  Minimal crepitus of L knee  Neurologic:  alert & oriented X3 and DTRs symmetrical and normal.   Skin:  Intact without suspicious lesions or rashes Cervical Nodes:  No lymphadenopathy noted Axillary Nodes:  No palpable lymphadenopathy Inguinal Nodes:  No significant adenopathy Psych:  memory intact for recent and remote, normally interactive, and good eye contact.     Impression & Recommendations:  Problem # 1:  ROUTINE GENERAL MEDICAL EXAM@HEALTH  CARE FACL (ICD-V70.0)  Orders: EKG w/ Interpretation (93000)  Problem # 2:  HYPERLIPIDEMIA (ICD-272.4)  His updated medication list for this problem includes:    Crestor 10 Mg Tabs (Rosuvastatin calcium) .Marland Kitchen... Take 1 tab once daily at bedtime  Complete Medication List: 1)  Crestor 10 Mg Tabs (Rosuvastatin calcium) .... Take 1 tab once daily at bedtime 2)  Aspirin 81 Mg Tbec (Aspirin) .Marland Kitchen.. 1 by mouth once daily  Lipid Assessment/Plan:      Based on NCEP/ATP III, the patient's risk factor category is "0-1 risk factors".  The patient's lipid goals are as follows: Total cholesterol goal is 200; LDL cholesterol goal is 120; HDL cholesterol goal is 40;  Triglyceride goal is 150.  His LDL cholesterol goal has been met.    Patient Instructions: 1)  Schedule fasting labs; see Codes for Diagnoses: 2)  BMP ; 3)  Hepatic Panel; 4)  Lipid Panel; 5)  TSH ; 6)  CBC w/ Diff; 7)  PSA(optional). Prescriptions: CRESTOR 10 MG TABS (ROSUVASTATIN CALCIUM) take 1 tab once daily at bedtime  #30 Tablet x 11   Entered and Authorized by:   Marga Melnick MD   Signed by:   Marga Melnick MD on 01/31/2010   Method used:   Print then Give to Patient   RxID:   [id number]    Orders Added: 1)  Est. Patient 65& > [14782] 2)  EKG w/ Interpretation [93000]   Immunization History:  Tetanus/Td Immunization History:    Tetanus/Td:  historical (03/06/2008)   Immunization History:  Tetanus/Td Immunization History:    Tetanus/Td:  Historical (03/06/2008)

## 2010-02-20 NOTE — Procedures (Signed)
Summary: Colonoscopy/[facility]  Colonoscopy/[facility]   Imported By: Sherian Rein 02/11/2010 11:49:05  _____________________________________________________________________  External Attachment:    Type:   Image     Comment:   External Document

## 2010-03-26 LAB — COMPREHENSIVE METABOLIC PANEL
ALT: 28 U/L (ref 0–53)
AST: 25 U/L (ref 0–37)
Alkaline Phosphatase: 81 U/L (ref 39–117)
CO2: 27 mEq/L (ref 19–32)
Calcium: 9.4 mg/dL (ref 8.4–10.5)
GFR calc Af Amer: >60 mL/min (ref >60)
GFR calc non Af Amer: >60 mL/min (ref >60)
Glucose, Bld: 114 mg/dL — ABNORMAL HIGH (ref 70–99)
Potassium: 4.5 mEq/L (ref 3.5–5.1)
Sodium: 140 mEq/L (ref 135–145)
Total Protein: 7 g/dL (ref 6.0–8.3)

## 2010-03-26 LAB — CBC
Hemoglobin: 16.5 g/dL (ref 13.0–17.0)
MCHC: 34 g/dL (ref 30.0–36.0)
RBC: 5.07 MIL/uL (ref 4.22–5.81)
WBC: 11.3 10*3/uL — ABNORMAL HIGH (ref 4.0–10.5)

## 2010-03-26 LAB — LIPASE, BLOOD: Lipase: 25 U/L (ref 11–59)

## 2010-03-26 LAB — DIFFERENTIAL
Basophils Relative: 0 % (ref 0–1)
Eosinophils Absolute: 0 10*3/uL (ref 0.0–0.7)
Eosinophils Relative: 0 % (ref 0–5)
Lymphs Abs: 1.4 10*3/uL (ref 0.7–4.0)

## 2010-05-23 NOTE — Assessment & Plan Note (Signed)
[facility]                        [place] OFFICE NOTE   David Allen, David Allen                      MRN:          [id number]  DATE:05/07/2006                            DOB:          07-09-1945    David Allen, date of birth Apr 04, 1945, unit number [id number],  was seen May 07, 2006 to discuss Vytorin.   He exercises playing tennis and also at [employer] at least 3 days a  week @ high level with no cardiac symptoms.  He has been on no specific  diet.  He drinks socially.   There is no stroke or heart attack in the family, but his NMR lipid  profile suggested at least a 15% risk based on LDL of 178 with  approximately 2000 total particles and approximately 950 small dense  particles.  He has been on Vytorin 10/20; his LDL is now 33, which would  be associated with approximately 5% risk.   EXAM:  Blood pressure is 130/72; he has no carotid bruits.  An S4  slurring is noted.   The pathophysiology of plaque size and composition was discussed.  Options were also discussed.  In view of the almost 2/3 drop in his LDL,  he wishes to continue the Vytorin.  Because of his values that have  improved so dramatically, I have encouraged him to decrease the dose to  1/2 of 10/20 at night and recheck the fasting lipids, SGPT, SGOT in 3  months.  At that time, an A1c can also be checked to assess long term  diabetic risk.  HisNMR LDL goal should be at least less than 110.     Titus Dubin. Alwyn Ren, MD,FACP,FCCP  Electronically Signed    [facility]  DD: 05/07/2006  DT: 05/07/2006  Job #: [id number]

## 2011-01-06 HISTORY — PX: SKIN CANCER EXCISION: SHX779

## 2011-02-02 ENCOUNTER — Encounter: Payer: Self-pay | Admitting: Internal Medicine

## 2011-02-02 ENCOUNTER — Ambulatory Visit (INDEPENDENT_AMBULATORY_CARE_PROVIDER_SITE_OTHER): Payer: BC Managed Care – PPO | Admitting: Internal Medicine

## 2011-02-02 VITALS — BP 122/80 | HR 84 | Temp 98.5°F | Resp 12 | Ht 70.25 in | Wt 208.8 lb

## 2011-02-02 DIAGNOSIS — Z0000 Encounter for general adult medical examination without abnormal findings: Secondary | ICD-10-CM

## 2011-02-02 DIAGNOSIS — Z1211 Encounter for screening for malignant neoplasm of colon: Secondary | ICD-10-CM

## 2011-02-02 DIAGNOSIS — E785 Hyperlipidemia, unspecified: Secondary | ICD-10-CM

## 2011-02-02 DIAGNOSIS — R9431 Abnormal electrocardiogram [ECG] [EKG]: Secondary | ICD-10-CM | POA: Insufficient documentation

## 2011-02-02 LAB — BASIC METABOLIC PANEL
CO2: 28 mEq/L (ref 19–32)
Calcium: 9.4 mg/dL (ref 8.4–10.5)
Creatinine, Ser: 1 mg/dL (ref 0.4–1.5)
GFR: 77.2 mL/min (ref >60.00)

## 2011-02-02 LAB — HEPATIC FUNCTION PANEL
Alkaline Phosphatase: 91 U/L (ref 39–117)
Bilirubin, Direct: 0.2 mg/dL (ref 0.0–0.3)

## 2011-02-02 LAB — CBC WITH DIFFERENTIAL/PLATELET
Basophils Absolute: 0 10*3/uL (ref 0.0–0.1)
Eosinophils Absolute: 0.2 10*3/uL (ref 0.0–0.7)
Lymphocytes Relative: 16.5 % (ref 12.0–46.0)
MCHC: 34.4 g/dL (ref 30.0–36.0)
Neutrophils Relative %: 75.1 % (ref 43.0–77.0)
RBC: 5.32 Mil/uL (ref 4.22–5.81)
RDW: 12.9 % (ref 11.5–14.6)

## 2011-02-02 LAB — LIPID PANEL
HDL: 55.4 mg/dL (ref >39.00)
LDL Cholesterol: 83 mg/dL (ref 0–99)
Total CHOL/HDL Ratio: 3
VLDL: 23 mg/dL (ref 0.0–40.0)

## 2011-02-02 LAB — TSH: TSH: 2.05 u[IU]/mL (ref 0.35–5.50)

## 2011-02-02 NOTE — Progress Notes (Signed)
Subjective:    Patient ID: David Allen, male    DOB: [birth year], 66 y.o.   MRN: [id number]  HPI  David Allen  is here for a physical; he denies acute issues .     Review of Systems Patient reports no significant  vision/ hearing changes,anorexia, weight change, fever ,adenopathy, persistant / recurrent hoarseness, swallowing issues, chest pain,palpitations, edema,persistant / recurrent cough, hemoptysis, dyspnea(rest, exertional, paroxysmal nocturnal), gastrointestinal  bleeding (melena, rectal bleeding), abdominal pain, excessive heart burn, GU symptoms( dysuria, hematuria, pyuria, voiding/incontinence  issues) syncope, focal weakness, memory loss,numbness & tingling, skin/hair/nail changes,depression, anxiety, abnormal bruising/bleeding,or  musculoskeletal symptoms/signs.      Objective:   Physical Exam Gen.: Healthy and well-nourished in appearance. Alert, appropriate and cooperative throughout exam. Head: Normocephalic without obvious abnormalities; pattern alopecia  Eyes: No corneal or conjunctival inflammation noted. Pupils equal round reactive to light and accommodation. Fundal exam is benign without hemorrhages, exudate, papilledema. Extraocular motion intact. Vision grossly normal with lenses. Ears: External  ear exam reveals no significant lesions or deformities. Canals clear .TMs normal. Hearing is grossly normal bilaterally. Nose: External nasal exam reveals no deformity or inflammation. Nasal mucosa are pink and moist. No lesions or exudates noted.  Mouth: Oral mucosa and oropharynx reveal no lesions or exudates. Teeth in good repair. Neck: No deformities, masses, or tenderness noted. Range of motion &Thyroid normal Lungs: Normal respiratory effort; chest expands symmetrically. Lungs are clear to auscultation without rales, wheezes, or increased work of breathing. Heart: Normal rate and rhythm. Normal S1 and S2. No gallop, click, or rub. S 4 w/o murmur. Abdomen: Bowel sounds  normal; abdomen soft and nontender. No masses, organomegaly or hernias noted. Genitalia/DRE: Genital exam is unremarkable. Prostate is normal without enlargement, asymmetry, nodularity, or induration.                                                                                  Musculoskeletal/extremities: No deformity or scoliosis noted of  the thoracic or lumbar spine. No clubbing, cyanosis, edema, or deformity noted. Range of motion  normal .Tone & strength  normal.Joints normal. Nail health  good. Vascular: Carotid, radial artery, dorsalis pedis and  posterior tibial pulses are full and equal. No bruits present. Neurologic: Alert and oriented x3. Deep tendon reflexes symmetrical and normal.          Skin: Intact without suspicious lesions or rashes. Lymph: No cervical, axillary, or inguinal lymphadenopathy present. Psych: Mood and affect are normal. Normally interactive                                                                                         Assessment & Plan:  #1 comprehensive physical exam; no acute findings #2 see Problem List with Assessments & Recommendations Plan: see Orders

## 2011-02-02 NOTE — Patient Instructions (Signed)
Preventive Health Care: Exercise at least 30-45 minutes a day,  3-4 days a week.  Eat a low-fat diet with lots of fruits and vegetables, up to 7-9 servings per day. Consume less than 40 grams of sugar per day from foods & drinks with High Fructose Corn Sugar as # 1,2,3 or # 4 on label. Zicam Melts or Zinc lozenges ; vitamin C 2000 mg daily; & Echinacea for 4-7 days. Report fever, exudate("pus") or progressive pain. As per the Standard of Care , screening Colonoscopy recommended @ 50 & every 5-10 years thereafter . More frequent monitor would be dictated by family history or findings @ Colonoscopy

## 2011-02-16 ENCOUNTER — Other Ambulatory Visit: Payer: Self-pay | Admitting: Internal Medicine

## 2011-07-06 ENCOUNTER — Encounter: Payer: Self-pay | Admitting: Internal Medicine

## 2012-03-03 ENCOUNTER — Other Ambulatory Visit: Payer: Self-pay | Admitting: Internal Medicine

## 2012-04-13 ENCOUNTER — Encounter: Payer: Self-pay | Admitting: Internal Medicine

## 2012-04-14 ENCOUNTER — Encounter: Payer: Self-pay | Admitting: Internal Medicine

## 2012-04-14 ENCOUNTER — Ambulatory Visit (INDEPENDENT_AMBULATORY_CARE_PROVIDER_SITE_OTHER): Payer: BC Managed Care – PPO | Admitting: Internal Medicine

## 2012-04-14 VITALS — BP 126/80 | HR 73 | Resp 14 | Ht 70.0 in | Wt 209.0 lb

## 2012-04-14 DIAGNOSIS — E785 Hyperlipidemia, unspecified: Secondary | ICD-10-CM

## 2012-04-14 DIAGNOSIS — C4499 Other specified malignant neoplasm of skin, unspecified: Secondary | ICD-10-CM | POA: Insufficient documentation

## 2012-04-14 DIAGNOSIS — Z0000 Encounter for general adult medical examination without abnormal findings: Secondary | ICD-10-CM

## 2012-04-14 LAB — CBC WITH DIFFERENTIAL/PLATELET
Basophils Relative: 0.6 % (ref 0.0–3.0)
Eosinophils Absolute: 0.1 10*3/uL (ref 0.0–0.7)
Eosinophils Relative: 1.9 % (ref 0.0–5.0)
Lymphocytes Relative: 29.1 % (ref 12.0–46.0)
MCHC: 34.1 g/dL (ref 30.0–36.0)
Neutrophils Relative %: 60.3 % (ref 43.0–77.0)
RBC: 5.11 Mil/uL (ref 4.22–5.81)
WBC: 6.4 10*3/uL (ref 4.5–10.5)

## 2012-04-14 LAB — TSH: TSH: 1.97 u[IU]/mL (ref 0.35–5.50)

## 2012-04-14 LAB — BASIC METABOLIC PANEL
Calcium: 9.1 mg/dL (ref 8.4–10.5)
Creatinine, Ser: 1.1 mg/dL (ref 0.4–1.5)

## 2012-04-14 LAB — HEPATIC FUNCTION PANEL
Alkaline Phosphatase: 78 U/L (ref 39–117)
Bilirubin, Direct: 0.1 mg/dL (ref 0.0–0.3)
Total Protein: 7.6 g/dL (ref 6.0–8.3)

## 2012-04-14 LAB — LIPID PANEL
HDL: 49.4 mg/dL (ref >39.00)
Total CHOL/HDL Ratio: 3

## 2012-04-14 MED ORDER — ATORVASTATIN CALCIUM 20 MG PO TABS: 20.0000 mg | ORAL_TABLET | Freq: Every day | ORAL | Status: DC

## 2012-04-14 NOTE — Progress Notes (Signed)
Subjective:    Patient ID: David Allen, male    DOB: [birth year], 67 y.o.   MRN: [id number]  HPI  He is here for a physical; he denies acute issues.     Review of Systems He is on a modified  heart healthy diet; he exercises 60 minutes 3-4 times per week without symptoms. Specifically he denies chest pain, palpitations, dyspnea, or claudication. Family history is negative for premature coronary disease. Advanced cholesterol testing reveals his LDL goal is less than 120, ideally < 90..    Objective:   Physical Exam Gen.: Healthy and well-nourished in appearance. Alert, appropriate and cooperative throughout exam. Appears younger than stated age  Head: Normocephalic without obvious abnormalities;pattern alopecia  Eyes: No corneal or conjunctival inflammation noted. Extraocular motion intact. Vision grossly normal without lenses Ears: External  ear exam reveals no significant lesions or deformities. Canals clear .TMs normal. Hearing is grossly normal bilaterally. Nose: External nasal exam reveals no deformity or inflammation. Nasal mucosa are pink and moist. No lesions or exudates noted.   Mouth: Oral mucosa and oropharynx reveal no lesions or exudates. Teeth in good repair. Neck: No deformities, masses, or tenderness noted. Range of motion & Thyroid normal. Lungs: Normal respiratory effort; chest expands symmetrically. Lungs are clear to auscultation without rales, wheezes, or increased work of breathing. Heart: Normal rate and rhythm. Normal S1 and S2. No gallop, click, or rub. S4 w/o murmur. Abdomen: Bowel sounds normal; abdomen soft and nontender. No masses, organomegaly or hernias noted. Genitalia: Genitalia normal . Prostate is normal without enlargement, asymmetry, nodularity, or induration.                            Musculoskeletal/extremities: There is some asymmetry of the posterior thoracic musculature suggesting occult scoliosis. No clubbing, cyanosis, edema, or significant  extremity  deformity noted. Range of motion normal .Tone & strength  Normal. Joints normal . Nail health good. Able to lie down & sit up w/o help. Negative SLR bilaterally Vascular: Carotid, radial artery, dorsalis pedis and  posterior tibial pulses are full and equal. No bruits present. Neurologic: Alert and oriented x3. Deep tendon reflexes symmetrical and normal.       Skin: Numerous solar keratoses especially over the scalp. Contact dermatitis left forearm. Lymph: No cervical, axillary, or inguinal lymphadenopathy present. Psych: Mood and affect are normal. Normally interactive                                                                                      Assessment & Plan:  #1 comprehensive physical exam; no acute findings  Plan: see Orders  & Recommendations

## 2012-04-14 NOTE — Patient Instructions (Addendum)
Please  schedule fasting Labs in 10-11 weeks after statin changes: CK,Lipids, hepatic panel. PLEASE BRING THESE INSTRUCTIONS TO FOLLOW UP  LAB APPOINTMENT.This will guarantee correct labs are drawn, eliminating need for repeat blood sampling ( needle sticks ! ). Diagnoses /Codes: 272.4, 995.20.   If you activate the  My Chart system; lab & Xray results will be released directly  to you as soon as I review & address these through the computer. If you choose not to sign up for My Chart within 36 hours of labs being drawn; results will be reviewed & interpretation added before being copied & mailed, causing a delay in getting the results to you.If you do not receive that report within 7-10 days ,please call. Additionally you can use this system to gain direct  access to your records  if  out of town or @ an office of a  physician who is not in  the My Chart network.  This improves continuity of care & places you in control of your medical record. Review and correct the record as indicated. Please share record with all medical staff seen.

## 2012-04-19 ENCOUNTER — Encounter: Payer: Self-pay | Admitting: *Deleted

## 2012-09-01 ENCOUNTER — Encounter: Payer: Self-pay | Admitting: Internal Medicine

## 2012-09-06 ENCOUNTER — Other Ambulatory Visit: Payer: Self-pay | Admitting: Internal Medicine

## 2012-09-07 NOTE — Telephone Encounter (Signed)
Med filled.  

## 2012-12-04 ENCOUNTER — Encounter (HOSPITAL_COMMUNITY): Payer: Self-pay | Admitting: Emergency Medicine

## 2012-12-04 ENCOUNTER — Emergency Department (INDEPENDENT_AMBULATORY_CARE_PROVIDER_SITE_OTHER)
Admission: EM | Admit: 2012-12-04 | Discharge: 2012-12-04 | Disposition: A | Discharge status: Home / Self Care | Payer: BC Managed Care – PPO | Source: Home / Self Care | Attending: Family Medicine | Admitting: Family Medicine

## 2012-12-04 ENCOUNTER — Other Ambulatory Visit: Payer: Self-pay | Admitting: Internal Medicine

## 2012-12-04 DIAGNOSIS — B9789 Other viral agents as the cause of diseases classified elsewhere: Secondary | ICD-10-CM

## 2012-12-04 DIAGNOSIS — J029 Acute pharyngitis, unspecified: Secondary | ICD-10-CM

## 2012-12-04 DIAGNOSIS — B349 Viral infection, unspecified: Secondary | ICD-10-CM

## 2012-12-04 MED ORDER — METHYLPREDNISOLONE SODIUM SUCC 125 MG IJ SOLR
INTRAMUSCULAR | Status: AC
  Filled 2012-12-04: qty 2

## 2012-12-04 MED ORDER — METHYLPREDNISOLONE SODIUM SUCC 125 MG IJ SOLR
80.0000 mg | Freq: Once | INTRAMUSCULAR | Status: AC
  Administered 2012-12-04: 80 mg via INTRAMUSCULAR

## 2012-12-04 NOTE — ED Provider Notes (Signed)
CSN: [id number]     Arrival date & time 12/04/12  0908 History   None    Chief Complaint  Patient presents with  . Sore Throat    Patient is a 67 y.o. male presenting with pharyngitis. The history is provided by the patient.  Sore Throat This is a new problem. The current episode started 2 days ago. The problem occurs constantly. The problem has been gradually worsening. Pertinent negatives include no chest pain, no abdominal pain, no headaches and no shortness of breath. The symptoms are aggravated by swallowing. Nothing relieves the symptoms.  Pt reports onset of URI symptoms approx 3 days ago that included runny nose, increased PND and occasional cough. 2 days ago pt noted onset of sore throat that has worsened since onset becoming much worse last night. Has also noted low grade temp as high as 100 at home. Admits to being around grand children over thanksgiving w/ cold like symptoms. Denies N/V/D, abd pain, CP or SOB. Pt admits having tonsillectomy as a child.   Past Medical History  Diagnosis Date  . Hyperlipidemia    Past Surgical History  Procedure Laterality Date  . Appendectomy  01/06/1995    in [place]  . Colonoscopy  2003    Negative (pending in 2014);[facility]  . Rotator cuff repair      Dr.Sypher  . Tonsillectomy    . Hernia repair  1985  . Skin cancer excision  2013    L temple   Family History  Problem Relation Age of Onset  . Lung cancer Father     smoker  . Dementia Mother   . Dementia Maternal Grandmother     ? Alzheimers  . Breast cancer Other     Maternal Cousin-double Mastectomy   . Diabetes Neg Hx   . Heart disease Neg Hx   . Stroke Neg Hx    History  Substance Use Topics  . Smoking status: Former Smoker    Quit date: 01/06/1976  . Smokeless tobacco: Not on file     Comment: smoked 1970-78, up 2 ppd  . Alcohol Use: Yes     Comment: socially , < 2 drinks / day on average    Review of Systems  Constitutional: Positive for fever. Negative  for chills.  HENT: Positive for congestion, postnasal drip, rhinorrhea, sore throat and trouble swallowing. Negative for ear pain, sinus pressure and voice change.   Respiratory: Negative for shortness of breath.   Cardiovascular: Negative.  Negative for chest pain.  Gastrointestinal: Negative.  Negative for abdominal pain.  Endocrine: Negative.   Genitourinary: Negative.   Musculoskeletal: Negative.   Skin: Negative.   Allergic/Immunologic: Negative.   Neurological: Negative.  Negative for headaches.  Hematological: Negative.   Psychiatric/Behavioral: Negative.     Allergies  Review of patient's allergies indicates no known allergies.  Home Medications   Current Outpatient Rx  Name  Route  Sig  Dispense  Refill  . simvastatin (ZOCOR) 10 MG tablet   Oral   Take 10 mg by mouth daily.         Marland Kitchen atorvastatin (LIPITOR) 20 MG tablet      Take one tablet by mouth one time daily   30 tablet   2    BP 147/80  Pulse 79  Temp(Src) 99.2 F (37.3 C) (Oral)  Resp 18  SpO2 97% Physical Exam  Constitutional: He is oriented to person, place, and time. He appears well-developed and well-nourished. No distress.  HENT:  Head: Normocephalic and atraumatic.  Right Ear: Tympanic membrane, external ear and ear canal normal.  Left Ear: Tympanic membrane, external ear and ear canal normal.  Nose: Nose normal. Right sinus exhibits no maxillary sinus tenderness and no frontal sinus tenderness. Left sinus exhibits no maxillary sinus tenderness and no frontal sinus tenderness.  Mouth/Throat: Uvula is midline and mucous membranes are normal. Posterior oropharyngeal erythema present. No oropharyngeal exudate, posterior oropharyngeal edema or tonsillar abscesses.  Eyes: Conjunctivae are normal.  Neck: Neck supple.  Cardiovascular: Normal rate and regular rhythm.   Pulmonary/Chest: Effort normal and breath sounds normal.  Musculoskeletal: Normal range of motion.  Neurological: He is alert and  oriented to person, place, and time.  Skin: Skin is warm and dry.  Psychiatric: He has a normal mood and affect.    ED Course  Procedures (including critical care time) Labs Review Labs Reviewed - No data to display Imaging Review No results found.  EKG Interpretation    Date/Time:    Ventricular Rate:    PR Interval:    QRS Duration:   QT Interval:    QTC Calculation:   R Axis:     Text Interpretation:              MDM  No diagnosis found.  3 day h/o URI sx's associated w/ severe sore throat. Pharyngeal erythema. Rapid strep screen neg. Likely viral. Solu-Medrol 80mg  given IM. Will recommend continued Ibuprofen and Chloraseptic spray for pain relief. Pt agreeable w/ plan.     Leanne Chang, NP 12/04/12 1009  Leanne Chang, NP 12/04/12 1009

## 2012-12-04 NOTE — ED Notes (Signed)
67 yr old is here today with complaints of a sorethroat x 3 dys; He states his symptoms started as a cold and his throat starting hurting really bad last night. He states he does have a little a cough/runny nose.  Denies: ear pain; sinus press; HA;

## 2012-12-05 NOTE — Telephone Encounter (Signed)
Atorvastatin refilled per protocol 

## 2012-12-06 LAB — CULTURE, GROUP A STREP

## 2012-12-07 NOTE — ED Provider Notes (Signed)
Medical screening examination/treatment/procedure(s) were performed by resident physician or non-physician practitioner and as supervising physician I was immediately available for consultation/collaboration.   Supreme Rybarczyk,Darel DOUGLAS MD.   Kaylin D Lonzy Mato, MD 12/07/12 1908 

## 2013-03-24 ENCOUNTER — Ambulatory Visit (INDEPENDENT_AMBULATORY_CARE_PROVIDER_SITE_OTHER): Payer: Managed Care, Other (non HMO) | Admitting: Internal Medicine

## 2013-03-24 DIAGNOSIS — A09 Infectious gastroenteritis and colitis, unspecified: Secondary | ICD-10-CM

## 2013-03-24 DIAGNOSIS — Z23 Encounter for immunization: Secondary | ICD-10-CM

## 2013-03-24 MED ORDER — AZITHROMYCIN 500 MG PO TABS: 500.0000 mg | ORAL_TABLET | Freq: Every day | ORAL | Status: DC

## 2013-03-24 NOTE — Progress Notes (Signed)
Cc: travel to [place] Subjective:    Patient ID: David Allen, male    DOB: [birth year], 68 y.o.   MRN: [id number]  HPI David Allen is a 68yo M with hx of HLD who is traveling to [place] with his sons this coming April. Will be going to [place], [place], [place]. Has traveled to [place], [place] nad [place] before. Has had hep a vac and typhoid in 2010, tetanus in 2010. Flu in 2013.  No Known Allergies   Current Outpatient Prescriptions on File Prior to Visit  Medication Sig Dispense Refill  . atorvastatin (LIPITOR) 20 MG tablet TAKE ONE TABLET BY MOUTH ONE TIME DAILY   30 tablet  5  . simvastatin (ZOCOR) 10 MG tablet Take 10 mg by mouth daily.       No current facility-administered medications on file prior to visit.   Past Medical History  Diagnosis Date  . Hyperlipidemia       Review of Systems     Objective:   Physical Exam      Assessment & Plan:  Pre-travel vaccination = will get typhoid injection today. No need for yellow fever per his current itinerary. Malaria proph = will not need prophylaxis on current travel itinerary. Recommended mosquito repellant  Traveler's diarrhea = will give rx for azithromycin

## 2013-04-18 ENCOUNTER — Other Ambulatory Visit (INDEPENDENT_AMBULATORY_CARE_PROVIDER_SITE_OTHER): Payer: Managed Care, Other (non HMO)

## 2013-04-18 ENCOUNTER — Ambulatory Visit (INDEPENDENT_AMBULATORY_CARE_PROVIDER_SITE_OTHER): Payer: Managed Care, Other (non HMO) | Admitting: Internal Medicine

## 2013-04-18 ENCOUNTER — Encounter: Payer: Self-pay | Admitting: Internal Medicine

## 2013-04-18 VITALS — BP 130/80 | HR 65 | Temp 97.8°F | Ht 70.0 in | Wt 210.0 lb

## 2013-04-18 DIAGNOSIS — E785 Hyperlipidemia, unspecified: Secondary | ICD-10-CM

## 2013-04-18 DIAGNOSIS — R9431 Abnormal electrocardiogram [ECG] [EKG]: Secondary | ICD-10-CM

## 2013-04-18 DIAGNOSIS — C4499 Other specified malignant neoplasm of skin, unspecified: Secondary | ICD-10-CM

## 2013-04-18 DIAGNOSIS — W9411XA Exposure to residence or prolonged visit at high altitude, initial encounter: Secondary | ICD-10-CM

## 2013-04-18 DIAGNOSIS — Z1211 Encounter for screening for malignant neoplasm of colon: Secondary | ICD-10-CM

## 2013-04-18 LAB — TSH: TSH: 2.18 u[IU]/mL (ref 0.35–5.50)

## 2013-04-18 LAB — CBC WITH DIFFERENTIAL/PLATELET
BASOS PCT: 0.8 % (ref 0.0–3.0)
Basophils Absolute: 0.1 10*3/uL (ref 0.0–0.1)
EOS ABS: 0.1 10*3/uL (ref 0.0–0.7)
EOS PCT: 1.3 % (ref 0.0–5.0)
HCT: 46.9 % (ref 39.0–52.0)
HEMOGLOBIN: 16 g/dL (ref 13.0–17.0)
LYMPHS PCT: 28.1 % (ref 12.0–46.0)
Lymphs Abs: 1.9 10*3/uL (ref 0.7–4.0)
MCHC: 34.2 g/dL (ref 30.0–36.0)
MCV: 95.3 fl (ref 78.0–100.0)
MONOS PCT: 7.3 % (ref 3.0–12.0)
Monocytes Absolute: 0.5 10*3/uL (ref 0.1–1.0)
NEUTROS ABS: 4.2 10*3/uL (ref 1.4–7.7)
NEUTROS PCT: 62.5 % (ref 43.0–77.0)
Platelets: 204 10*3/uL (ref 150.0–400.0)
RBC: 4.92 Mil/uL (ref 4.22–5.81)
RDW: 12.8 % (ref 11.5–14.6)
WBC: 6.8 10*3/uL (ref 4.5–10.5)

## 2013-04-18 LAB — HEPATIC FUNCTION PANEL
ALK PHOS: 77 U/L (ref 39–117)
ALT: 25 U/L (ref 0–53)
AST: 25 U/L (ref 0–37)
Albumin: 4.3 g/dL (ref 3.5–5.2)
BILIRUBIN DIRECT: 0.2 mg/dL (ref 0.0–0.3)
TOTAL PROTEIN: 7.3 g/dL (ref 6.0–8.3)
Total Bilirubin: 1.1 mg/dL (ref 0.3–1.2)

## 2013-04-18 LAB — BASIC METABOLIC PANEL
BUN: 16 mg/dL (ref 6–23)
CALCIUM: 9.2 mg/dL (ref 8.4–10.5)
CO2: 24 mEq/L (ref 19–32)
CREATININE: 1 mg/dL (ref 0.4–1.5)
Chloride: 105 mEq/L (ref 96–112)
GFR: 81.29 mL/min (ref >60.00)
Glucose, Bld: 96 mg/dL (ref 70–99)
Potassium: 4.6 mEq/L (ref 3.5–5.1)
SODIUM: 139 meq/L (ref 135–145)

## 2013-04-18 LAB — LIPID PANEL
CHOL/HDL RATIO: 3
Cholesterol: 149 mg/dL (ref 0–200)
HDL: 47.5 mg/dL (ref >39.00)
LDL Cholesterol: 86 mg/dL (ref 0–99)
Triglycerides: 77 mg/dL (ref 0.0–149.0)
VLDL: 15.4 mg/dL (ref 0.0–40.0)

## 2013-04-18 MED ORDER — ACETAZOLAMIDE 125 MG PO TABS: 125.0000 mg | ORAL_TABLET | Freq: Three times a day (TID) | ORAL | Status: DC

## 2013-04-18 MED ORDER — ATORVASTATIN CALCIUM 20 MG PO TABS: ORAL_TABLET | ORAL | Status: DC

## 2013-04-18 NOTE — Progress Notes (Signed)
Subjective:    Patient ID: David Allen, male    DOB: Dec 28, 1945, 68 y.o.   MRN: [id number]  HPI  He  is here to assess active health issues & conditions. PMH, FH, & Social history verified & updated . A modified  heart healthy diet is followed; exercise encompasses 60-90  minutes 3-4   times per week as gym & tennis  without symptoms.  Family history is neg for premature coronary disease. Advanced cholesterol testing reveals  LDL goal is less than 1209 ; ideally < 90 . There is medication compliance with the statin.  Low dose ASA not taken    Review of Systems Specifically denied are  chest pain, palpitations, dyspnea, or claudication.  Significant abdominal symptoms, memory deficit, or myalgias not present. He denies unexplained weight loss, abdominal pain, significant dyspepsia, dysphagia, melena, rectal bleeding, or persistently small caliber stools. No colonoscopy since 2003.  He will be traveling to [place] and exposed to high altitudes ;he is leaving 04/22/13.Marland KitchenHe has been to [place].       Objective:   Physical Exam  Gen.: Healthy and well-nourished in appearance. Alert, appropriate and cooperative throughout exam. Appears younger than stated age  Head: Normocephalic without obvious abnormalities; pattern alopecia  Eyes: No corneal or conjunctival inflammation noted. Pupils equal round reactive to light and accommodation. Extraocular motion intact. Ophthalmology exam UTD. Ears: External  ear exam reveals no significant lesions or deformities. Canals clear .TMs normal. Hearing is grossly normal bilaterally. Nose: External nasal exam reveals no deformity or inflammation. Nasal mucosa are pink and moist. No lesions or exudates noted.   Mouth: Oral mucosa and oropharynx reveal no lesions or exudates. Teeth in good repair. Neck: No deformities, masses, or tenderness noted. Range of motion & Thyroid normal Lungs: Normal respiratory effort; chest expands symmetrically.  Lungs are clear to auscultation without rales, wheezes, or increased work of breathing. Heart: Normal rate and rhythm. Normal S1 and S2. No gallop, click, or rub. No murmur. Abdomen: Bowel sounds normal; abdomen soft and nontender. No masses, or organomegaly .Ventral  hernia noted. Genitalia: Genitalia normal except for left varices. Prostate is normal without enlargement, asymmetry, nodularity, or induration                                   Musculoskeletal/extremities: No deformity or scoliosis noted of  the thoracic or lumbar spine.  No clubbing, cyanosis, edema, or significant extremity  deformity noted. Range of motion normal .Tone & strength normal. Hand joints normal except Flexion L 5th finger.  Fingernail  health good. Able to lie down & sit up w/o help. Negative SLR bilaterally Vascular: Carotid, radial artery, dorsalis pedis and  posterior tibial pulses are full and equal. No bruits present. Neurologic: Alert and oriented x3. Deep tendon reflexes symmetrical and normal.  Gait normal . Skin: Intact without suspicious lesions or rashes. Lymph: No cervical, axillary, or inguinal lymphadenopathy present. Psych: Mood and affect are normal. Normally interactive  Assessment & Plan:  #1 hyperlipidemia #2 colonoscopy overdue #3 pending high altitude exposure See orders

## 2013-04-18 NOTE — Progress Notes (Signed)
Pre visit review using our clinic review tool, if applicable. No additional management support is needed unless otherwise documented below in the visit note. 

## 2013-04-18 NOTE — Patient Instructions (Signed)
Your next office appointment will be determined based upon review of your pending labs . Those instructions will be transmitted to you through My Chart   As per the Standard of Care , screening Colonoscopy recommended @ 50 & every 5-10 years thereafter . More frequent monitor would be dictated by family history or findings @ Colonoscopy 

## 2013-04-18 NOTE — Assessment & Plan Note (Signed)
CBC & dif 

## 2013-04-18 NOTE — Assessment & Plan Note (Signed)
Lipids, LFTs, TSH ,BMET 

## 2013-05-19 ENCOUNTER — Encounter: Payer: Self-pay | Admitting: Internal Medicine

## 2013-07-09 ENCOUNTER — Other Ambulatory Visit: Payer: Self-pay | Admitting: Internal Medicine

## 2013-10-20 ENCOUNTER — Other Ambulatory Visit: Payer: Self-pay

## 2014-01-03 ENCOUNTER — Other Ambulatory Visit: Payer: Self-pay | Admitting: Internal Medicine

## 2014-02-17 ENCOUNTER — Emergency Department (HOSPITAL_COMMUNITY)
Admission: EM | Admit: 2014-02-17 | Discharge: 2014-02-17 | Disposition: A | Discharge status: Home / Self Care | Payer: Managed Care, Other (non HMO) | Source: Home / Self Care | Attending: Family Medicine | Admitting: Family Medicine

## 2014-02-17 ENCOUNTER — Encounter (HOSPITAL_COMMUNITY): Payer: Self-pay

## 2014-02-17 DIAGNOSIS — J069 Acute upper respiratory infection, unspecified: Secondary | ICD-10-CM

## 2014-02-17 MED ORDER — AZITHROMYCIN 250 MG PO TABS: ORAL_TABLET | ORAL | Status: DC

## 2014-02-17 MED ORDER — IPRATROPIUM BROMIDE 0.06 % NA SOLN: 2.0000 | Freq: Four times a day (QID) | NASAL | Status: DC

## 2014-02-17 NOTE — ED Notes (Signed)
C/o URI x past few days, w ST, congestion. Minimal relief w OTC, flonase. NAD

## 2014-02-17 NOTE — ED Provider Notes (Signed)
CSN: [id number]     Arrival date & time 02/17/14  9381 History   First MD Initiated Contact with Patient 02/17/14 [phone]     Chief Complaint  Patient presents with  . URI   (Consider location/radiation/quality/duration/timing/severity/associated sxs/prior Treatment) Patient is a 69 y.o. male presenting with URI. The history is provided by the patient.  URI Presenting symptoms: congestion, cough, rhinorrhea and sore throat   Presenting symptoms: no fever   Severity:  Mild Onset quality:  Gradual Duration:  5 days Chronicity:  New Relieved by:  None tried Worsened by:  Nothing tried Ineffective treatments:  None tried Associated symptoms: no swollen glands and no wheezing   Risk factors: no recent illness and no recent travel     Past Medical History  Diagnosis Date  . Hyperlipidemia    Past Surgical History  Procedure Laterality Date  . Appendectomy  01/06/1995    in [place]  . Colonoscopy  2003    Negative ;[facility]  . Rotator cuff repair      Dr.Sypher  . Tonsillectomy    . Hernia repair  1985  . Skin cancer excision  2013    L temple   Family History  Problem Relation Age of Onset  . Lung cancer Father     smoker  . Heart attack Father 71  . Dementia Mother   . Dementia Maternal Grandmother     ? Alzheimers  . Breast cancer Other     Maternal Cousin-double Mastectomy   . Diabetes Neg Hx   . Stroke Neg Hx    History  Substance Use Topics  . Smoking status: Former Smoker    Quit date: 01/06/1976  . Smokeless tobacco: Not on file     Comment: smoked 1970-78, up 2 ppd  . Alcohol Use: Yes     Comment: socially , < 2 drinks / day on average    Review of Systems  Constitutional: Negative.  Negative for fever.  HENT: Positive for congestion, postnasal drip, rhinorrhea and sore throat.   Respiratory: Positive for cough. Negative for wheezing.   Cardiovascular: Negative.   Gastrointestinal: Negative.     Allergies  Review of patient's allergies  indicates no known allergies.  Home Medications   Prior to Admission medications   Medication Sig Start Date End Date Taking? Authorizing Provider  simvastatin (ZOCOR) 10 MG tablet Take 10 mg by mouth daily.   Yes Historical Provider, MD  acetaZOLAMIDE (DIAMOX) 125 MG tablet Take 1 tablet (125 mg total) by mouth 3 (three) times daily. 04/18/13   Hendricks Limes, MD  atorvastatin (LIPITOR) 20 MG tablet TAKE ONE TABLET BY MOUTH ONE TIME DAILY 04/18/13   Hendricks Limes, MD  atorvastatin (LIPITOR) 20 MG tablet TAKE ONE TABLET BY MOUTH ONE TIME DAILY  01/03/14   Hendricks Limes, MD  azithromycin (ZITHROMAX Z-PAK) 250 MG tablet Take as directed on pack 02/17/14   Billy Fischer, MD  ipratropium (ATROVENT) 0.06 % nasal spray Place 2 sprays into both nostrils 4 (four) times daily. 02/17/14   Billy Fischer, MD   BP 118/84 mmHg  Pulse 77  Temp(Src) 99 F (37.2 C) (Oral)  Resp 14  SpO2 96% Physical Exam  Constitutional: He is oriented to person, place, and time. He appears well-developed and well-nourished. No distress.  HENT:  Head: Normocephalic.  Right Ear: External ear normal.  Left Ear: External ear normal.  Nose: Mucosal edema and rhinorrhea present.  Mouth/Throat: Oropharynx is clear  and moist.  Eyes: Conjunctivae and EOM are normal. Pupils are equal, round, and reactive to light.  Neck: Normal range of motion. Neck supple.  Cardiovascular: Normal heart sounds and intact distal pulses.   Pulmonary/Chest: Effort normal and breath sounds normal.  Lymphadenopathy:    He has no cervical adenopathy.  Neurological: He is alert and oriented to person, place, and time.  Skin: Skin is warm and dry.  Psychiatric: He has a normal mood and affect.  Nursing note and vitals reviewed.   ED Course  Procedures (including critical care time) Labs Review Labs Reviewed - No data to display  Imaging Review No results found.   MDM   1. URI (upper respiratory infection)        Billy Fischer, MD 02/17/14 1003

## 2014-02-17 NOTE — Discharge Instructions (Signed)
Drink plenty of fluids as discussed, use medicine as prescribed, and mucinex or delsym for cough. Return or see your doctor if further problems °

## 2014-06-13 ENCOUNTER — Other Ambulatory Visit: Payer: Self-pay | Admitting: Internal Medicine

## 2014-06-13 NOTE — Telephone Encounter (Signed)
lipitor rx sent to pharm-----patient needs office visit before any further refills, will need to have fasting labs drawn on day of next office visit per dr hopper

## 2014-06-13 NOTE — Telephone Encounter (Signed)
#  30 Needs fasting labs & OV

## 2014-06-13 NOTE — Telephone Encounter (Signed)
Patients last office visit 04/2013---please advise, thanks

## 2014-07-02 ENCOUNTER — Other Ambulatory Visit: Payer: Self-pay

## 2014-07-11 ENCOUNTER — Other Ambulatory Visit: Payer: Self-pay | Admitting: Internal Medicine

## 2014-10-08 ENCOUNTER — Encounter: Payer: Self-pay | Admitting: Internal Medicine

## 2014-10-08 ENCOUNTER — Ambulatory Visit (INDEPENDENT_AMBULATORY_CARE_PROVIDER_SITE_OTHER): Payer: Managed Care, Other (non HMO) | Admitting: Internal Medicine

## 2014-10-08 VITALS — BP 134/66 | HR 85 | Temp 97.9°F | Resp 16 | Ht 70.0 in | Wt 207.0 lb

## 2014-10-08 DIAGNOSIS — G250 Essential tremor: Secondary | ICD-10-CM | POA: Diagnosis not present

## 2014-10-08 DIAGNOSIS — Z23 Encounter for immunization: Secondary | ICD-10-CM | POA: Diagnosis not present

## 2014-10-08 DIAGNOSIS — C4499 Other specified malignant neoplasm of skin, unspecified: Secondary | ICD-10-CM | POA: Diagnosis not present

## 2014-10-08 DIAGNOSIS — G252 Other specified forms of tremor: Secondary | ICD-10-CM | POA: Insufficient documentation

## 2014-10-08 DIAGNOSIS — E785 Hyperlipidemia, unspecified: Secondary | ICD-10-CM

## 2014-10-08 NOTE — Assessment & Plan Note (Signed)
BMET,TSH See AVS

## 2014-10-08 NOTE — Progress Notes (Signed)
Subjective:    Patient ID: David Allen, male    DOB: 23-May-1945, 69 y.o.   MRN: [id number]  HPI The patient is here to assess status of active health conditions.  PMH, FH, & Social History reviewed & updated.No change in FH as recorded.  He's on a modified heart healthy diet. He does ingest salt. He has been compliant with his statin but he is not sure whether he is taking atorvastatin or simvastatin. He quit smoking in 1978. He typically has 1 alcoholic beverage per day. He exercises for 60 minutes 3 times a week at the gym and plays tennis once a week. He has no associated cardiopulmonary symptoms.  He has not had a colonoscopy since 2003. He states "I keep putting it off". The standard of care was discussed with him. Review of Systems   His main concern is a tremor of his hands with intentional activities such as holding a hymnal at church.  He has nocturia once nightly.  Chest pain, palpitations, tachycardia, exertional dyspnea, paroxysmal nocturnal dyspnea, claudication or edema are absent. No unexplained weight loss, abdominal pain, significant dyspepsia, dysphagia, melena, rectal bleeding, or persistently small caliber stools. Dysuria, pyuria, hematuria, frequency,  or polyuria are denied. Change in hair, skin, nails denied. No bowel changes of constipation or diarrhea. No intolerance to heat or cold.     Objective:   Physical Exam  Pertinent or positive findings include: Pattern alopecia is present. Solar skin & scalp changes present. Crepitus is present in the knees. Genitourinary exam is negative. Prostate is normal size without induration, asymmetry, or nodularity.  General appearance :adequately nourished; in no distress.  Eyes: No conjunctival inflammation or scleral icterus is present.  Oral exam:  Lips and gums are healthy appearing.There is no oropharyngeal erythema or exudate noted. Dental hygiene is good.  Heart:  Normal rate and regular rhythm. S1 and S2  normal without gallop, murmur, click, rub or other extra sounds    Lungs:Chest clear to auscultation; no wheezes, rhonchi,rales ,or rubs present.No increased work of breathing.   Abdomen: bowel sounds normal, soft and non-tender without masses, organomegaly or hernias noted.  No guarding or rebound. No flank tenderness to percussion.  Vascular : all pulses equal ; no bruits present.  Skin:Warm & dry.  Intact without suspicious lesions or rashes ; no tenting or jaundice   Lymphatic: No lymphadenopathy is noted about the head, neck, axilla, or inguinal areas.   Neuro: Strength, tone & DTRs normal.         Assessment & Plan:  See Current Assessment & Plan in Problem List under specific Diagnosis

## 2014-10-08 NOTE — Progress Notes (Signed)
Pre visit review using our clinic review tool, if applicable. No additional management support is needed unless otherwise documented below in the visit note. 

## 2014-10-08 NOTE — Assessment & Plan Note (Signed)
CBC and differential 

## 2014-10-08 NOTE — Patient Instructions (Signed)
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 It is a tremor typically aggravated by purposeful activity. Additionally stimulants such as decongestants, diet pills, nicotine, or caffeine can aggravate it. If it is problematic the nonselective beta blocker propranolol could be prescribed.   Your next office appointment will be determined based upon review of your pending labs  and  xrays  Those written interpretation of the lab results and instructions will be transmitted to you by My Chart  Critical results will be called.   Followup as needed for any active or acute issue. Please report any significant change in your symptoms.

## 2014-10-09 ENCOUNTER — Other Ambulatory Visit (INDEPENDENT_AMBULATORY_CARE_PROVIDER_SITE_OTHER): Payer: Managed Care, Other (non HMO)

## 2014-10-09 ENCOUNTER — Telehealth: Payer: Self-pay | Admitting: Internal Medicine

## 2014-10-09 DIAGNOSIS — G250 Essential tremor: Secondary | ICD-10-CM

## 2014-10-09 DIAGNOSIS — E785 Hyperlipidemia, unspecified: Secondary | ICD-10-CM | POA: Diagnosis not present

## 2014-10-09 DIAGNOSIS — C4499 Other specified malignant neoplasm of skin, unspecified: Secondary | ICD-10-CM

## 2014-10-09 LAB — HEPATIC FUNCTION PANEL
ALT: 17 U/L (ref 0–53)
AST: 18 U/L (ref 0–37)
Albumin: 4.1 g/dL (ref 3.5–5.2)
Alkaline Phosphatase: 91 U/L (ref 39–117)
BILIRUBIN DIRECT: 0.2 mg/dL (ref 0.0–0.3)
BILIRUBIN TOTAL: 0.9 mg/dL (ref 0.2–1.2)
Total Protein: 7 g/dL (ref 6.0–8.3)

## 2014-10-09 LAB — LIPID PANEL
CHOL/HDL RATIO: 3
CHOLESTEROL: 141 mg/dL (ref 0–200)
HDL: 51.2 mg/dL (ref >39.00)
LDL CALC: 73 mg/dL (ref 0–99)
NonHDL: 90.09
TRIGLYCERIDES: 85 mg/dL (ref 0.0–149.0)
VLDL: 17 mg/dL (ref 0.0–40.0)

## 2014-10-09 LAB — BASIC METABOLIC PANEL
BUN: 22 mg/dL (ref 6–23)
CHLORIDE: 106 meq/L (ref 96–112)
CO2: 25 meq/L (ref 19–32)
CREATININE: 1.03 mg/dL (ref 0.40–1.50)
Calcium: 8.9 mg/dL (ref 8.4–10.5)
GFR: 75.53 mL/min (ref >60.00)
GLUCOSE: 94 mg/dL (ref 70–99)
POTASSIUM: 4.2 meq/L (ref 3.5–5.1)
Sodium: 141 mEq/L (ref 135–145)

## 2014-10-09 LAB — CBC WITH DIFFERENTIAL/PLATELET
Basophils Absolute: 0 10*3/uL (ref 0.0–0.1)
Basophils Relative: 0.3 % (ref 0.0–3.0)
EOS ABS: 0.1 10*3/uL (ref 0.0–0.7)
EOS PCT: 1.9 % (ref 0.0–5.0)
HCT: 46.8 % (ref 39.0–52.0)
HEMOGLOBIN: 15.8 g/dL (ref 13.0–17.0)
LYMPHS ABS: 1 10*3/uL (ref 0.7–4.0)
Lymphocytes Relative: 14.5 % (ref 12.0–46.0)
MCHC: 33.8 g/dL (ref 30.0–36.0)
MCV: 96.3 fl (ref 78.0–100.0)
MONO ABS: 0.5 10*3/uL (ref 0.1–1.0)
Monocytes Relative: 7.5 % (ref 3.0–12.0)
NEUTROS ABS: 5.4 10*3/uL (ref 1.4–7.7)
Neutrophils Relative %: 75.8 % (ref 43.0–77.0)
PLATELETS: 181 10*3/uL (ref 150.0–400.0)
RBC: 4.86 Mil/uL (ref 4.22–5.81)
RDW: 12.4 % (ref 11.5–15.5)
WBC: 7.2 10*3/uL (ref 4.0–10.5)

## 2014-10-09 LAB — TSH: TSH: 2.25 u[IU]/mL (ref 0.35–4.50)

## 2014-10-09 NOTE — Telephone Encounter (Signed)
Pt came by to request Atorvastatin 20 mg- one daily needs to be added to his medication list.  Pt was seen yesterday for his CPE.

## 2014-10-09 NOTE — Telephone Encounter (Signed)
Med has been added

## 2014-10-17 ENCOUNTER — Other Ambulatory Visit: Payer: Self-pay | Admitting: Internal Medicine

## 2015-09-17 ENCOUNTER — Ambulatory Visit: Payer: Managed Care, Other (non HMO)

## 2015-09-18 ENCOUNTER — Ambulatory Visit (INDEPENDENT_AMBULATORY_CARE_PROVIDER_SITE_OTHER): Payer: Managed Care, Other (non HMO)

## 2015-09-18 DIAGNOSIS — Z23 Encounter for immunization: Secondary | ICD-10-CM | POA: Diagnosis not present

## 2015-09-25 ENCOUNTER — Other Ambulatory Visit: Payer: Self-pay | Admitting: Emergency Medicine

## 2015-09-25 MED ORDER — ATORVASTATIN CALCIUM 20 MG PO TABS: 20.0000 mg | ORAL_TABLET | Freq: Every day | ORAL | 0 refills | Status: DC

## 2015-12-22 NOTE — Progress Notes (Signed)
Subjective:    Patient ID: David Allen, male    DOB: 10-27-1945, 70 y.o.   MRN: [id number]  HPI  Here for medicare wellness exam and follow up of his chronic medical conditions.   I have personally reviewed and have noted 1.The patient's medical and social history 2.Their use of alcohol, tobacco or illicit drugs 3.Their current medications and supplements 4.The patient's functional ability including ADL's, fall risks, home safety risks                 and hearing or visual impairment. 5.Diet and physical activities 6.Evidence for depression or mood disorders 7.Care team reviewed  - Derm - Dr Yetta Barre   Are there smokers in your home (other than you)? No  Risk Factors Exercise: goes to gym 3/week, tennis 1/week Dietary issues discussed: eats what he wants, not always as healthy as he should eat   Cardiac risk factors: advanced age, hyperlipidemia, and obesity (BMI >= 30 kg/m2).  Depression Screen  Have you felt down, depressed or hopeless? No  Have you felt little interest or pleasure in doing things?  No  Activities of Daily Living In your present state of health, do you have any difficulty performing the following activities?:  Driving? No Managing money?  No Feeding yourself? No Getting from bed to chair? No Climbing a flight of stairs? No Preparing food and eating?: No Bathing or showering? No Getting dressed: No Getting to/using the toilet? No Moving around from place to place: No In the past year have you fallen or had a near fall?: No   Are you sexually active?  yes  Do you have more than one partner?  no  Hearing Difficulties: No Do you often ask people to speak up or repeat themselves? No Do you experience ringing or noises in your ears? yes Do you have difficulty understanding soft or whispered voices? No Vision:              Any change in vision:  no             Up to date with eye exam:    yes Memory:  Do you feel that you have a problem with memory? No  Do you often misplace items? No  Do you feel safe at home?  Yes  Cognitive Testing  Alert, Orientated? Yes  Normal Appearance? Yes  Recall of three objects?  Yes  Can perform simple calculations? Yes  Displays appropriate judgment? Yes  Can read the correct time from a watch face? Yes   Advanced Directives have been discussed with the patient? Yes- in place   Medications and allergies reviewed with patient and updated if appropriate.  Patient Active Problem List   Diagnosis Date Noted  . 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 Recurrent skin cancer 04/14/2012  . Nonspecific abnormal electrocardiogram (ECG) (EKG) 02/02/2011  . Hyperlipidemia 05/06/2006    No current outpatient prescriptions on file prior to visit.   No current facility-administered medications on file prior to visit.     Past Medical History:  Diagnosis Date  . Hyperlipidemia     Past Surgical History:  Procedure Laterality Date  . APPENDECTOMY  01/06/1995   in [place]  . COLONOSCOPY  2003   Negative ;[facility]  . HERNIA REPAIR  1985  . ROTATOR CUFF REPAIR     Dr.Sypher  . SKIN CANCER EXCISION  2013   [facility]  . TONSILLECTOMY      Social History  Social History  . Marital status: Married    Spouse name: N/A  . Number of children: N/A  . Years of education: N/A   Social History Main Topics  . Smoking status: Former Smoker    Quit date: 01/06/1976  . Smokeless tobacco: Not on file     Comment: smoked 1970-78, up 2 ppd  . Alcohol use Yes     Comment: socially , < 2 drinks / day on average  . Drug use: No  . Sexual activity: Not on file   Other Topics Concern  . Not on file   Social History Narrative  . No narrative on file    Family History  Problem Relation Age of Onset  . Lung cancer Father     smoker  . Heart attack Father 34  . Dementia Mother   . Dementia Maternal Grandmother     ? Alzheimers  . Breast  cancer Other     Maternal Cousin-double Mastectomy   . Diabetes Neg Hx   . Stroke Neg Hx     Review of Systems  Constitutional: Negative for appetite change, chills, fatigue, fever and unexpected weight change.  HENT: Positive for tinnitus. Negative for hearing loss.   Eyes: Negative for visual disturbance.  Respiratory: Negative for cough, shortness of breath and wheezing.   Cardiovascular: Negative for chest pain, palpitations and leg swelling.  Gastrointestinal: Negative for abdominal pain, blood in stool, constipation, diarrhea and nausea.       No gerd  Genitourinary: Negative for difficulty urinating, dysuria and hematuria.  Musculoskeletal: Negative for arthralgias and back pain.  Skin: Negative for color change and rash.  Neurological: Negative for dizziness, light-headedness, numbness and headaches.  Psychiatric/Behavioral: Negative for dysphoric mood. The patient is not nervous/anxious.        Objective:   Vitals:   12/23/15 0830  BP: 134/76  Pulse: 82  Resp: 16  Temp: 97.8 F (36.6 C)   Filed Weights   12/23/15 0830  Weight: 211 lb (95.7 kg)   Body mass index is 30.28 kg/m.   Physical Exam Constitutional: He appears well-developed and well-nourished. No distress.  HENT:  Head: Normocephalic and atraumatic.  Right Ear: External ear normal.  Left Ear: External ear normal.  Mouth/Throat: Oropharynx is clear and moist.  Normal ear canals and TM b/l  Eyes: Conjunctivae and EOM are normal.  Neck: Neck supple. No tracheal deviation present. No thyromegaly present.  No carotid bruit  Cardiovascular: Normal rate, regular rhythm, normal heart sounds and intact distal pulses.   No murmur heard. Pulmonary/Chest: Effort normal and breath sounds normal. No respiratory distress. He has no wheezes. He has no rales.  Abdominal: Soft. Ventral hernia. He exhibits no distension. There is no tenderness.  Genitourinary: deferred  Musculoskeletal: He exhibits no edema.    Lymphadenopathy:   He has no cervical adenopathy.  Skin: Skin is warm and dry. He is not diaphoretic.  Psychiatric: He has a normal mood and affect. His behavior is normal.       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   See Problem List for Assessment and Plan of chronic medical problems.    F/u in one year for a wellness visit

## 2015-12-23 ENCOUNTER — Other Ambulatory Visit (INDEPENDENT_AMBULATORY_CARE_PROVIDER_SITE_OTHER): Payer: Medicare Other

## 2015-12-23 ENCOUNTER — Ambulatory Visit (INDEPENDENT_AMBULATORY_CARE_PROVIDER_SITE_OTHER): Payer: Medicare Other | Admitting: Internal Medicine

## 2015-12-23 ENCOUNTER — Encounter: Payer: Self-pay | Admitting: Internal Medicine

## 2015-12-23 VITALS — BP 134/76 | HR 82 | Temp 97.8°F | Resp 16 | Ht 70.0 in | Wt 211.0 lb

## 2015-12-23 DIAGNOSIS — Z0000 Encounter for general adult medical examination without abnormal findings: Secondary | ICD-10-CM

## 2015-12-23 DIAGNOSIS — C4499 Other specified malignant neoplasm of skin, unspecified: Secondary | ICD-10-CM | POA: Diagnosis not present

## 2015-12-23 DIAGNOSIS — Z1211 Encounter for screening for malignant neoplasm of colon: Secondary | ICD-10-CM | POA: Diagnosis not present

## 2015-12-23 DIAGNOSIS — E7800 Pure hypercholesterolemia, unspecified: Secondary | ICD-10-CM | POA: Diagnosis not present

## 2015-12-23 DIAGNOSIS — Z1159 Encounter for screening for other viral diseases: Secondary | ICD-10-CM

## 2015-12-23 DIAGNOSIS — Z23 Encounter for immunization: Secondary | ICD-10-CM

## 2015-12-23 LAB — LIPID PANEL
CHOLESTEROL: 173 mg/dL (ref 0–200)
HDL: 54 mg/dL (ref >39.00)
LDL Cholesterol: 96 mg/dL (ref 0–99)
NonHDL: 118.5
Total CHOL/HDL Ratio: 3
Triglycerides: 115 mg/dL (ref 0.0–149.0)
VLDL: 23 mg/dL (ref 0.0–40.0)

## 2015-12-23 LAB — COMPREHENSIVE METABOLIC PANEL
ALBUMIN: 4.4 g/dL (ref 3.5–5.2)
ALK PHOS: 85 U/L (ref 39–117)
ALT: 21 U/L (ref 0–53)
AST: 20 U/L (ref 0–37)
BUN: 20 mg/dL (ref 6–23)
CO2: 27 mEq/L (ref 19–32)
Calcium: 9.4 mg/dL (ref 8.4–10.5)
Chloride: 106 mEq/L (ref 96–112)
Creatinine, Ser: 1.02 mg/dL (ref 0.40–1.50)
GFR: 76.12 mL/min (ref >60.00)
Glucose, Bld: 112 mg/dL — ABNORMAL HIGH (ref 70–99)
POTASSIUM: 4.3 meq/L (ref 3.5–5.1)
Sodium: 141 mEq/L (ref 135–145)
TOTAL PROTEIN: 6.9 g/dL (ref 6.0–8.3)
Total Bilirubin: 0.9 mg/dL (ref 0.2–1.2)

## 2015-12-23 LAB — CBC WITH DIFFERENTIAL/PLATELET
Basophils Absolute: 0 10*3/uL (ref 0.0–0.1)
Basophils Relative: 0.6 % (ref 0.0–3.0)
EOS PCT: 1.8 % (ref 0.0–5.0)
Eosinophils Absolute: 0.1 10*3/uL (ref 0.0–0.7)
HCT: 47.6 % (ref 39.0–52.0)
HEMOGLOBIN: 16.6 g/dL (ref 13.0–17.0)
LYMPHS ABS: 1.7 10*3/uL (ref 0.7–4.0)
Lymphocytes Relative: 26.5 % (ref 12.0–46.0)
MCHC: 34.8 g/dL (ref 30.0–36.0)
MCV: 94.2 fl (ref 78.0–100.0)
Monocytes Absolute: 0.5 10*3/uL (ref 0.1–1.0)
Monocytes Relative: 7.6 % (ref 3.0–12.0)
Neutro Abs: 4 10*3/uL (ref 1.4–7.7)
Neutrophils Relative %: 63.5 % (ref 43.0–77.0)
Platelets: 199 10*3/uL (ref 150.0–400.0)
RBC: 5.05 Mil/uL (ref 4.22–5.81)
RDW: 12.4 % (ref 11.5–15.5)
WBC: 6.3 10*3/uL (ref 4.0–10.5)

## 2015-12-23 LAB — TSH: TSH: 1.75 u[IU]/mL (ref 0.35–4.50)

## 2015-12-23 MED ORDER — ATORVASTATIN CALCIUM 20 MG PO TABS: 20.0000 mg | ORAL_TABLET | Freq: Every day | ORAL | 3 refills | Status: DC

## 2015-12-23 NOTE — Patient Instructions (Addendum)
[name] , Thank you for taking time to come for your Medicare Wellness Visit. I appreciate your ongoing commitment to your health goals. Please review the following plan we discussed and let me know if I can assist you in the future.   These are the goals we discussed: Goals    Work on improving your diet and work on [employer] loss      This is a list of the screening recommended for you and due dates:  Health Maintenance  Topic Date Due  .  Hepatitis C: One time screening is recommended by Center for Disease Control  (CDC) for  adults born from [birth year] through 1967.   [birth year]  . Colon Cancer Screening  02/16/1993  . Tetanus Vaccine  03/07/2018  . Flu Shot  Completed  . Shingles Vaccine  Completed  . Pneumonia vaccines  Completed     Test(s) ordered today. Your results will be released to MyChart (or called to you) after review, usually within 72hours after test completion. If any changes need to be made, you will be notified at that same time.  All other Health Maintenance issues reviewed.   All recommended immunizations and age-appropriate screenings are up-to-date or discussed.  Prevnar immunization administered today.   Medications reviewed and updated.  No changes recommended at this time.  Your prescription(s) have been submitted to your pharmacy. Please take as directed and contact our office if you believe you are having problem(s) with the medication(s).  Please followup in one year for a wellness visit   Health Maintenance, Male A healthy lifestyle and preventative care can promote health and wellness.  Maintain regular health, dental, and eye exams.  Eat a healthy diet. Foods like vegetables, fruits, whole grains, low-fat dairy products, and lean protein foods contain the nutrients you need and are low in calories. Decrease your intake of foods high in solid fats, added sugars, and salt. Get information about a proper diet from your health care provider, if  necessary.  Regular physical exercise is one of the most important things you can do for your health. Most adults should get at least 150 minutes of moderate-intensity exercise (any activity that increases your heart rate and causes you to sweat) each week. In addition, most adults need muscle-strengthening exercises on 2 or more days a week.   Maintain a healthy weight. The body mass index (BMI) is a screening tool to identify possible weight problems. It provides an estimate of body fat based on height and weight. Your health care provider can find your BMI and can help you achieve or maintain a healthy weight. For males 20 years and older:  A BMI below 18.5 is considered underweight.  A BMI of 18.5 to 24.9 is normal.  A BMI of 25 to 29.9 is considered overweight.  A BMI of 30 and above is considered obese.  Maintain normal blood lipids and cholesterol by exercising and minimizing your intake of saturated fat. Eat a balanced diet with plenty of fruits and vegetables. Blood tests for lipids and cholesterol should begin at age 46 and be repeated every 5 years. If your lipid or cholesterol levels are high, you are over age 67, or you are at high risk for heart disease, you may need your cholesterol levels checked more frequently.Ongoing high lipid and cholesterol levels should be treated with medicines if diet and exercise are not working.  If you smoke, find out from your health care provider how to quit. If you do  not use tobacco, do not start.  Lung cancer screening is recommended for adults aged 55-80 years who are at high risk for developing lung cancer because of a history of smoking. A yearly low-dose CT scan of the lungs is recommended for people who have at least a 30-pack-year history of smoking and are current smokers or have quit within the past 15 years. A pack year of smoking is smoking an average of 1 pack of cigarettes a day for 1 year (for example, a 30-pack-year history of  smoking could mean smoking 1 pack a day for 30 years or 2 packs a day for 15 years). Yearly screening should continue until the smoker has stopped smoking for at least 15 years. Yearly screening should be stopped for people who develop a health problem that would prevent them from having lung cancer treatment.  If you choose to drink alcohol, do not have more than 2 drinks per day. One drink is considered to be 12 oz (360 mL) of beer, 5 oz (150 mL) of wine, or 1.5 oz (45 mL) of liquor.  Avoid the use of street drugs. Do not share needles with anyone. Ask for help if you need support or instructions about stopping the use of drugs.  High blood pressure causes heart disease and increases the risk of stroke. High blood pressure is more likely to develop in:  People who have blood pressure in the end of the normal range (100-139/85-89 mm Hg).  People who are overweight or obese.  People who are African American.  If you are 72-69 years of age, have your blood pressure checked every 3-5 years. If you are 72 years of age or older, have your blood pressure checked every year. You should have your blood pressure measured twice-once when you are at a hospital or clinic, and once when you are not at a hospital or clinic. Record the average of the two measurements. To check your blood pressure when you are not at a hospital or clinic, you can use:  An automated blood pressure machine at a pharmacy.  A home blood pressure monitor.  If you are 72-42 years old, ask your health care provider if you should take aspirin to prevent heart disease.  Diabetes screening involves taking a blood sample to check your fasting blood sugar level. This should be done once every 3 years after age 49 if you are at a normal weight and without risk factors for diabetes. Testing should be considered at a younger age or be carried out more frequently if you are overweight and have at least 1 risk factor for  diabetes.  Colorectal cancer can be detected and often prevented. Most routine colorectal cancer screening begins at the age of 25 and continues through age 63. However, your health care provider may recommend screening at an earlier age if you have risk factors for colon cancer. On a yearly basis, your health care provider may provide home test kits to check for hidden blood in the stool. A small camera at the end of a tube may be used to directly examine the colon (sigmoidoscopy or colonoscopy) to detect the earliest forms of colorectal cancer. Talk to your health care provider about this at age 67 when routine screening begins. A direct exam of the colon should be repeated every 5-10 years through age 41, unless early forms of precancerous polyps or small growths are found.  People who are at an increased risk for hepatitis B should be  screened for this virus. You are considered at high risk for hepatitis B if:  You were born in a country where hepatitis B occurs often. Talk with your health care provider about which countries are considered high risk.  Your parents were born in a high-risk country and you have not received a shot to protect against hepatitis B (hepatitis B vaccine).  You have HIV or AIDS.  You use needles to inject street drugs.  You live with, or have sex with, someone who has hepatitis B.  You are a man who has sex with other men (MSM).  You get hemodialysis treatment.  You take certain medicines for conditions like cancer, organ transplantation, and autoimmune conditions.  Hepatitis C blood testing is recommended for all people born from [birth year] through 1965 and any individual with known risk factors for hepatitis C.  Healthy men should no longer receive prostate-specific antigen (PSA) blood tests as part of routine cancer screening. Talk to your health care provider about prostate cancer screening.  Testicular cancer screening is not recommended for adolescents or  adult males who have no symptoms. Screening includes self-exam, a health care provider exam, and other screening tests. Consult with your health care provider about any symptoms you have or any concerns you have about testicular cancer.  Practice safe sex. Use condoms and avoid high-risk sexual practices to reduce the spread of sexually transmitted infections (STIs).  You should be screened for STIs, including gonorrhea and chlamydia if:  You are sexually active and are younger than 24 years.  You are older than 24 years, and your health care provider tells you that you are at risk for this type of infection.  Your sexual activity has changed since you were last screened, and you are at an increased risk for chlamydia or gonorrhea. Ask your health care provider if you are at risk.  If you are at risk of being infected with HIV, it is recommended that you take a prescription medicine daily to prevent HIV infection. This is called pre-exposure prophylaxis (PrEP). You are considered at risk if:  You are a man who has sex with other men (MSM).  You are a heterosexual man who is sexually active with multiple partners.  You take drugs by injection.  You are sexually active with a partner who has HIV.  Talk with your health care provider about whether you are at high risk of being infected with HIV. If you choose to begin PrEP, you should first be tested for HIV. You should then be tested every 3 months for as long as you are taking PrEP.  Use sunscreen. Apply sunscreen liberally and repeatedly throughout the day. You should seek shade when your shadow is shorter than you. Protect yourself by wearing long sleeves, pants, a wide-brimmed hat, and sunglasses year round whenever you are outdoors.  Tell your health care provider of new moles or changes in moles, especially if there is a change in shape or color. Also, tell your health care provider if a mole is larger than the size of a pencil  eraser.  A one-time screening for abdominal aortic aneurysm (AAA) and surgical repair of large AAAs by ultrasound is recommended for men aged 65-75 years who are current or former smokers.  Stay current with your vaccines (immunizations). This information is not intended to replace advice given to you by your health care provider. Make sure you discuss any questions you have with your health care provider. Document Released: 06/20/2007 Document  Revised: 01/12/2014 Document Reviewed: 09/25/2014 Elsevier Interactive Patient Education  2017 [employer].

## 2015-12-23 NOTE — Assessment & Plan Note (Addendum)
Follows with Dr Ronnald Ramp - [facility]at[facility]

## 2015-12-23 NOTE — Assessment & Plan Note (Signed)
Check lipid panel  Continue daily statin Regular exercise and healthy diet encouraged  

## 2015-12-23 NOTE — Progress Notes (Signed)
Pre visit review using our clinic review tool, if applicable. No additional management support is needed unless otherwise documented below in the visit note. 

## 2015-12-24 ENCOUNTER — Encounter: Payer: Self-pay | Admitting: Internal Medicine

## 2015-12-24 DIAGNOSIS — R739 Hyperglycemia, unspecified: Secondary | ICD-10-CM | POA: Insufficient documentation

## 2015-12-24 LAB — HEPATITIS C ANTIBODY: HCV AB: NEGATIVE

## 2016-01-21 ENCOUNTER — Encounter: Payer: Self-pay | Admitting: Internal Medicine

## 2016-02-04 ENCOUNTER — Encounter (HOSPITAL_COMMUNITY): Payer: Self-pay | Admitting: Emergency Medicine

## 2016-02-04 ENCOUNTER — Emergency Department (HOSPITAL_COMMUNITY): Payer: Medicare Other

## 2016-02-04 ENCOUNTER — Observation Stay (HOSPITAL_COMMUNITY)
Admission: EM | Admit: 2016-02-04 | Discharge: 2016-02-05 | Disposition: A | Discharge status: Home / Self Care | Payer: Medicare Other | Attending: Internal Medicine | Admitting: Internal Medicine

## 2016-02-04 DIAGNOSIS — E785 Hyperlipidemia, unspecified: Secondary | ICD-10-CM | POA: Diagnosis present

## 2016-02-04 DIAGNOSIS — R269 Unspecified abnormalities of gait and mobility: Secondary | ICD-10-CM

## 2016-02-04 DIAGNOSIS — I6529 Occlusion and stenosis of unspecified carotid artery: Secondary | ICD-10-CM | POA: Insufficient documentation

## 2016-02-04 DIAGNOSIS — G459 Transient cerebral ischemic attack, unspecified: Secondary | ICD-10-CM | POA: Diagnosis not present

## 2016-02-04 DIAGNOSIS — R262 Difficulty in walking, not elsewhere classified: Secondary | ICD-10-CM

## 2016-02-04 DIAGNOSIS — E669 Obesity, unspecified: Secondary | ICD-10-CM | POA: Insufficient documentation

## 2016-02-04 DIAGNOSIS — Z6836 Body mass index (BMI) 36.0-36.9, adult: Secondary | ICD-10-CM | POA: Insufficient documentation

## 2016-02-04 DIAGNOSIS — R2689 Other abnormalities of gait and mobility: Secondary | ICD-10-CM | POA: Insufficient documentation

## 2016-02-04 DIAGNOSIS — Z87891 Personal history of nicotine dependence: Secondary | ICD-10-CM | POA: Insufficient documentation

## 2016-02-04 HISTORY — DX: Malignant (primary) neoplasm, unspecified: C80.1

## 2016-02-04 LAB — I-STAT TROPONIN, ED: TROPONIN I, POC: 0 ng/mL (ref 0.00–0.08)

## 2016-02-04 LAB — I-STAT CHEM 8, ED
BUN: 20 mg/dL (ref 6–20)
CALCIUM ION: 1.1 mmol/L — AB (ref 1.15–1.40)
Chloride: 103 mmol/L (ref 101–111)
Creatinine, Ser: 1.1 mg/dL (ref 0.61–1.24)
Glucose, Bld: 101 mg/dL — ABNORMAL HIGH (ref 65–99)
HCT: 46 % (ref 39.0–52.0)
HEMOGLOBIN: 15.6 g/dL (ref 13.0–17.0)
Potassium: 4.4 mmol/L (ref 3.5–5.1)
Sodium: 140 mmol/L (ref 135–145)
TCO2: 28 mmol/L (ref 0–100)

## 2016-02-04 LAB — COMPREHENSIVE METABOLIC PANEL
ALT: 29 U/L (ref 17–63)
AST: 29 U/L (ref 15–41)
Albumin: 4.1 g/dL (ref 3.5–5.0)
Alkaline Phosphatase: 84 U/L (ref 38–126)
Anion gap: 12 (ref 5–15)
BUN: 17 mg/dL (ref 6–20)
CO2: 25 mmol/L (ref 22–32)
Calcium: 9.3 mg/dL (ref 8.9–10.3)
Chloride: 103 mmol/L (ref 101–111)
Creatinine, Ser: 1.05 mg/dL (ref 0.61–1.24)
Glucose, Bld: 103 mg/dL — ABNORMAL HIGH (ref 65–99)
Potassium: 4.5 mmol/L (ref 3.5–5.1)
Sodium: 140 mmol/L (ref 135–145)
Total Bilirubin: 0.9 mg/dL (ref 0.3–1.2)
Total Protein: 6.9 g/dL (ref 6.5–8.1)

## 2016-02-04 LAB — DIFFERENTIAL
Basophils Absolute: 0.1 10*3/uL (ref 0.0–0.1)
Basophils Relative: 1 %
EOS PCT: 2 %
Eosinophils Absolute: 0.1 10*3/uL (ref 0.0–0.7)
LYMPHS PCT: 25 %
Lymphs Abs: 2.2 10*3/uL (ref 0.7–4.0)
MONO ABS: 0.5 10*3/uL (ref 0.1–1.0)
Monocytes Relative: 5 %
Neutro Abs: 5.8 10*3/uL (ref 1.7–7.7)
Neutrophils Relative %: 67 %

## 2016-02-04 LAB — CBC
HCT: 45 % (ref 39.0–52.0)
Hemoglobin: 15.6 g/dL (ref 13.0–17.0)
MCH: 33 pg (ref 26.0–34.0)
MCHC: 34.7 g/dL (ref 30.0–36.0)
MCV: 95.1 fL (ref 78.0–100.0)
Platelets: 184 10*3/uL (ref 150–400)
RBC: 4.73 MIL/uL (ref 4.22–5.81)
RDW: 12.2 % (ref 11.5–15.5)
WBC: 8.8 10*3/uL (ref 4.0–10.5)

## 2016-02-04 LAB — PROTIME-INR
INR: 1.05
Prothrombin Time: 13.7 seconds (ref 11.4–15.2)

## 2016-02-04 LAB — APTT: aPTT: 26 seconds (ref 24–36)

## 2016-02-04 MED ORDER — SODIUM CHLORIDE 0.9 % IV SOLN
INTRAVENOUS | Status: AC
  Administered 2016-02-05: 02:00:00 via INTRAVENOUS

## 2016-02-04 MED ORDER — ENOXAPARIN SODIUM 40 MG/0.4ML ~~LOC~~ SOLN
40.0000 mg | SUBCUTANEOUS | Status: DC
  Administered 2016-02-05: 40 mg via SUBCUTANEOUS
  Filled 2016-02-04: qty 0.4

## 2016-02-04 MED ORDER — SENNOSIDES-DOCUSATE SODIUM 8.6-50 MG PO TABS
1.0000 | ORAL_TABLET | Freq: Every evening | ORAL | Status: DC | PRN
  Filled 2016-02-04: qty 1

## 2016-02-04 MED ORDER — ATORVASTATIN CALCIUM 20 MG PO TABS
20.0000 mg | ORAL_TABLET | Freq: Every day | ORAL | Status: DC
  Administered 2016-02-05: 20 mg via ORAL
  Filled 2016-02-04: qty 1
  Filled 2016-02-04: qty 2

## 2016-02-04 MED ORDER — ACETAMINOPHEN 325 MG PO TABS: 650.0000 mg | ORAL_TABLET | ORAL | Status: DC | PRN

## 2016-02-04 MED ORDER — ACETAMINOPHEN 160 MG/5ML PO SOLN: 650.0000 mg | ORAL | Status: DC | PRN

## 2016-02-04 MED ORDER — ACETAMINOPHEN 650 MG RE SUPP: 650.0000 mg | RECTAL | Status: DC | PRN

## 2016-02-04 MED ORDER — STROKE: EARLY STAGES OF RECOVERY BOOK
Freq: Once | Status: AC
  Administered 2016-02-05: 02:00:00
  Filled 2016-02-04: qty 1

## 2016-02-04 MED ORDER — ASPIRIN 300 MG RE SUPP: 300.0000 mg | Freq: Every day | RECTAL | Status: DC

## 2016-02-04 MED ORDER — ASPIRIN 325 MG PO TABS
325.0000 mg | ORAL_TABLET | Freq: Every day | ORAL | Status: DC
  Administered 2016-02-05: 325 mg via ORAL
  Filled 2016-02-04: qty 1

## 2016-02-04 NOTE — ED Triage Notes (Signed)
Pt sts 90 min ago when walking into a meeting pt was not able to ambulate without favoring towards the right side; pt sts some ringing in his ears that has been intermittent; pt eval by Dr Jeanell Sparrow in triage

## 2016-02-04 NOTE — ED Notes (Signed)
Pt reports his balance symptoms have gone away.

## 2016-02-04 NOTE — ED Notes (Signed)
Patient transported to Ultrasound 

## 2016-02-04 NOTE — ED Provider Notes (Signed)
[facility] DEPT Provider Note   CSN: [id number] Arrival date & time: 02/04/16  1723     History   Chief Complaint Chief Complaint  Patient presents with  . balance problems    HPI RORKE PAGLIARULO is a 71 y.o. male.  Patient with acute onset of gait abnormality 336 after noon. Normal speech denies any numbness or weakness. Wife did state that the gait seem to be more leaning to the right side. Patient denied any dizziness or vertigo. Any nausea vomiting or headache. No fevers no recent illness. No history of any prior similar symptoms.      Past Medical History:  Diagnosis Date  . Hyperlipidemia     Patient Active Problem List   Diagnosis Date Noted  . Hyperglycemia 12/24/2015  . 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 Recurrent skin cancer 04/14/2012  . Nonspecific abnormal electrocardiogram (ECG) (EKG) 02/02/2011  . Hyperlipidemia 05/06/2006    Past Surgical History:  Procedure Laterality Date  . APPENDECTOMY  01/06/1995   in [place]  . COLONOSCOPY  2003   Negative ;[facility]  . HERNIA REPAIR  1985  . [place]     Dr.Sypher  . SKIN CANCER EXCISION  2013   [facility]  . TONSILLECTOMY         Home Medications    Prior to Admission medications   Medication Sig Start Date End Date Taking? Authorizing Provider  acetaminophen (TYLENOL) 500 MG tablet Take 1,000 mg by mouth every 6 (six) hours as needed.   Yes Historical Provider, MD  atorvastatin (LIPITOR) 20 MG tablet Take 1 tablet (20 mg total) by mouth daily. 12/23/15  Yes Binnie Rail, MD  Multiple Vitamins-Minerals (AIRBORNE) TBEF Take 1 tablet by mouth daily as needed (COLD).   Yes Historical Provider, MD    Family History Family History  Problem Relation Age of Onset  . Lung cancer Father     smoker  . Heart attack Father 6  . Dementia Mother   . Dementia Maternal Grandmother     ? Alzheimers  . Breast cancer Other     Maternal Cousin-double Mastectomy   . Diabetes Neg Hx   .  Stroke Neg Hx     Social History Social History  Substance Use Topics  . Smoking status: Former Smoker    Quit date: 01/06/1976  . Smokeless tobacco: Not on file     Comment: smoked 1970-78, up 2 ppd  . Alcohol use Yes     Comment: socially , < 2 drinks / day on average     Allergies   Patient has no known allergies.   Review of Systems Review of Systems  Constitutional: Negative for fever.  HENT: Negative for congestion.   Eyes: Negative for visual disturbance.  Respiratory: Negative for shortness of breath.   Cardiovascular: Negative for chest pain.  Gastrointestinal: Negative for abdominal pain, nausea and vomiting.  Genitourinary: Negative for dysuria.  Musculoskeletal: Positive for gait problem.  Skin: Negative for rash.  Neurological: Negative for dizziness and headaches.  Hematological: Does not bruise/bleed easily.  Psychiatric/Behavioral: Negative for confusion.     Physical Exam Updated Vital Signs BP 137/74   Pulse 70   Temp 97.6 F (36.4 C)   Resp 22   SpO2 99%   Physical Exam  Constitutional: He is oriented to person, place, and time. He appears well-developed and well-nourished. No distress.  HENT:  Head: Normocephalic and atraumatic.  Mouth/Throat: Oropharynx is clear and moist.  Eyes:  EOM are normal. Pupils are equal, round, and reactive to light.  Neck: Normal range of motion. Neck supple.  Cardiovascular: Normal rate, regular rhythm and normal heart sounds.   No murmur heard. Pulmonary/Chest: Effort normal and breath sounds normal. No respiratory distress.  Abdominal: Soft. Bowel sounds are normal. There is no tenderness.  Musculoskeletal: Normal range of motion. He exhibits no edema.  Neurological: He is alert and oriented to person, place, and time. No cranial nerve deficit or sensory deficit. He exhibits normal muscle tone. Coordination normal.  Skin: Skin is warm.  Nursing note and vitals reviewed.    ED Treatments / Results   Labs (all labs ordered are listed, but only abnormal results are displayed) Labs Reviewed  COMPREHENSIVE METABOLIC PANEL - Abnormal; Notable for the following:       Result Value   Glucose, Bld 103 (*)    All other components within normal limits  I-STAT CHEM 8, ED - Abnormal; Notable for the following:    Glucose, Bld 101 (*)    Calcium, Ion 1.10 (*)    All other components within normal limits  PROTIME-INR  APTT  CBC  DIFFERENTIAL  I-STAT TROPOININ, ED  CBG MONITORING, ED   Results for orders placed or performed during the hospital encounter of 02/04/16  Protime-INR  Result Value Ref Range   Prothrombin Time 13.7 11.4 - 15.2 seconds   INR 1.05   APTT  Result Value Ref Range   aPTT 26 24 - 36 seconds  CBC  Result Value Ref Range   WBC 8.8 4.0 - 10.5 K/uL   RBC 4.73 4.22 - 5.81 MIL/uL   Hemoglobin 15.6 13.0 - 17.0 g/dL   HCT 45.0 39.0 - 52.0 %   MCV 95.1 78.0 - 100.0 fL   MCH 33.0 26.0 - 34.0 pg   MCHC 34.7 30.0 - 36.0 g/dL   RDW 12.2 11.5 - 15.5 %   Platelets 184 150 - 400 K/uL  Differential  Result Value Ref Range   Neutrophils Relative % 67 %   Neutro Abs 5.8 1.7 - 7.7 K/uL   Lymphocytes Relative 25 %   Lymphs Abs 2.2 0.7 - 4.0 K/uL   Monocytes Relative 5 %   Monocytes Absolute 0.5 0.1 - 1.0 K/uL   Eosinophils Relative 2 %   Eosinophils Absolute 0.1 0.0 - 0.7 K/uL   Basophils Relative 1 %   Basophils Absolute 0.1 0.0 - 0.1 K/uL  Comprehensive metabolic panel  Result Value Ref Range   Sodium 140 135 - 145 mmol/L   Potassium 4.5 3.5 - 5.1 mmol/L   Chloride 103 101 - 111 mmol/L   CO2 25 22 - 32 mmol/L   Glucose, Bld 103 (H) 65 - 99 mg/dL   BUN 17 6 - 20 mg/dL   Creatinine, Ser 1.05 0.61 - 1.24 mg/dL   Calcium 9.3 8.9 - 10.3 mg/dL   Total Protein 6.9 6.5 - 8.1 g/dL   Albumin 4.1 3.5 - 5.0 g/dL   AST 29 15 - 41 U/L   ALT 29 17 - 63 U/L   Alkaline Phosphatase 84 38 - 126 U/L   Total Bilirubin 0.9 0.3 - 1.2 mg/dL   GFR calc non Af Amer >60 >60 mL/min    GFR calc Af Amer >60 >60 mL/min   Anion gap 12 5 - 15  I-stat troponin, ED  Result Value Ref Range   Troponin i, poc 0.00 0.00 - 0.08 ng/mL   Comment 3  I-Stat Chem 8, ED  Result Value Ref Range   Sodium 140 135 - 145 mmol/L   Potassium 4.4 3.5 - 5.1 mmol/L   Chloride 103 101 - 111 mmol/L   BUN 20 6 - 20 mg/dL   Creatinine, Ser 1.10 0.61 - 1.24 mg/dL   Glucose, Bld 101 (H) 65 - 99 mg/dL   Calcium, Ion 1.10 (L) 1.15 - 1.40 mmol/L   TCO2 28 0 - 100 mmol/L   Hemoglobin 15.6 13.0 - 17.0 g/dL   HCT 46.0 39.0 - 52.0 %     EKG  EKG Interpretation  Date/Time:  Tuesday February 04 2016 20:12:51 EST Ventricular Rate:  68 PR Interval:    QRS Duration: 101 QT Interval:  412 QTC Calculation: 439 R Axis:   -26 Text Interpretation:  Sinus rhythm Borderline left axis deviation Low voltage, precordial leads Abnormal R-wave progression, late transition No previous ECGs available Confirmed by Aissatou Fronczak  MD, Alasha Mcguinness [phone]) on 02/04/2016 8:22:23 PM       Radiology Ct Head Wo Contrast  Result Date: 02/04/2016 CLINICAL DATA:  Acute onset of gait problems; patient pulling to the right side. Mild dizziness and tinnitus, chronic in nature. Initial encounter. EXAM: CT HEAD WITHOUT CONTRAST TECHNIQUE: Contiguous axial images were obtained from the base of the skull through the vertex without intravenous contrast. COMPARISON:  None. FINDINGS: Brain: No evidence of acute infarction, hemorrhage, hydrocephalus, extra-axial collection or mass lesion/mass effect. Prominence of the ventricles and sulci suggests mild cortical volume loss. The brainstem and fourth ventricle are within normal limits. The basal ganglia are unremarkable in appearance. The cerebral hemispheres demonstrate grossly normal gray-white differentiation. No mass effect or midline shift is seen. Vascular: No hyperdense vessel or unexpected calcification. Skull: There is no evidence of fracture; visualized osseous structures are  unremarkable in appearance. Sinuses/Orbits: The orbits are within normal limits. The paranasal sinuses and mastoid air cells are well-aerated. Other: No significant soft tissue abnormalities are seen. IMPRESSION: 1. No acute intracranial pathology seen on CT. 2. Mild cortical volume loss noted. Electronically Signed   By: Garald Balding M.D.   On: 02/04/2016 18:15   Mr Brain Wo Contrast (neuro Protocol)  Result Date: 02/04/2016 CLINICAL DATA:  71 y/o  M; abnormal gait. EXAM: MRI HEAD WITHOUT CONTRAST TECHNIQUE: Multiplanar, multiecho pulse sequences of the brain and surrounding structures were obtained without intravenous contrast. COMPARISON:  02/04/2016 CT head. FINDINGS: Brain: No acute infarction, hemorrhage, hydrocephalus, extra-axial collection or mass lesion. Few foci of T2 FLAIR hyperintense signal abnormality in white matter are nonspecific but consistent with mild chronic microvascular ischemic change. Mild brain parenchymal volume loss. Vascular: Normal flow voids. Skull and upper cervical spine: Normal marrow signal. Sinuses/Orbits: Negative. Other: None. IMPRESSION: 1. No acute intracranial abnormality. 2. Mild chronic microvascular ischemic changes and parenchymal volume loss of the brain. Electronically Signed   By: Kristine Garbe M.D.   On: 02/04/2016 22:26    Procedures Procedures (including critical care time)  Medications Ordered in ED Medications - No data to display   Initial Impression / Assessment and Plan / ED Course  I have reviewed the triage vital signs and the nursing notes.  Pertinent labs & imaging results that were available during my care of the patient were reviewed by me and considered in my medical decision making (see chart for details).     Patient with acute onset of gait abnormalities at 3:30 this afternoon. Patient denies it being associated with vertigo or room spinning. There was some  question raised by the nurse that the scene to be leaning  more towards the right his wife, confirmed that there was maybe some question of some slight dizziness. Patient now is normal. MRI was negative head CT was negative. Discussed with neural hospitalist. They do recommend admission for TIA workup.  On my initial exam patient's are logical exam was negative. Seemed to have no coordination problems. After MRI gait was tested and was normal.  Final Clinical Impressions(s) / ED Diagnoses   Final diagnoses:  Transient cerebral ischemia, unspecified type    New Prescriptions New Prescriptions   No medications on file     Fredia Sorrow, MD 02/04/16 2314

## 2016-02-04 NOTE — H&P (Signed)
History and Physical    WOODLEY LANDT ZOX:[id number] DOB: [birth year] DOA: 02/04/2016  PCP: Pincus Sanes, MD   Patient coming from: Home  Chief Complaint: Gait disturbance   HPI: David Allen is a 71 y.o. male with medical history significant only for remote smoking and hyperlipidemia, now presenting to the emergency department for evaluation of an acute gait disturbance. Patient reports that he had been in his usual state of health and was having an uneventful day when at approximately 3:30 PM, he experienced difficulty walking, described as falling to the right. The patient did not actually fall, but required some assistance to avoid that. He denies any sensation of the room spinning or nausea and has never experienced similar symptoms previously. Patient denies any chest pain or palpitations and denies any headaches, change in vision or hearing, or focal numbness or weakness. Patient denies any recent fevers or chills, cough or dyspnea, abdominal pain, vomiting, or diarrhea. His only daily medication is Lipitor and he denies recent use of alcohol or illicit substances. He denies family history of CVA.  ED Course: Upon arrival to the ED, patient is found to be afebrile, saturating well on room air, and with vital signs stable. EKG featured sinus rhythm with late R transition. Noncontrast head CT is negative for acute intracranial abnormality, chemistry panel is unremarkable, CBC is entirely within normal limits, troponin is undetectable, and INR is normal. MRI of the brain was obtained and is negative for acute intracranial abnormality. Patient reported some improvement in his symptoms while in the emergency department. After the MRI, the patient ambulated in the ED and reported that symptoms had resolved. Neurology was consulted by the ED physician and advised a medical admission for TIA workup.  Review of Systems:  All other systems reviewed and apart from HPI, are negative.  Past Medical  History:  Diagnosis Date  . Hyperlipidemia     Past Surgical History:  Procedure Laterality Date  . APPENDECTOMY  01/06/1995   in [place]  . COLONOSCOPY  2003   Negative ;[facility]  . HERNIA REPAIR  1985  . ROTATOR CUFF REPAIR     Dr.Sypher  . SKIN CANCER EXCISION  2013   L [facility]  . TONSILLECTOMY       reports that he quit smoking about 40 years ago. He has never used smokeless tobacco. He reports that he drinks alcohol. He reports that he does not use drugs.  No Known Allergies  Family History  Problem Relation Age of Onset  . Lung cancer Father     smoker  . Heart attack Father 7  . Dementia Mother   . Dementia Maternal Grandmother     ? Alzheimers  . Breast cancer Other     Maternal Cousin-double Mastectomy   . Diabetes Neg Hx   . Stroke Neg Hx      Prior to Admission medications   Medication Sig Start Date End Date Taking? Authorizing Provider  acetaminophen (TYLENOL) 500 MG tablet Take 1,000 mg by mouth every 6 (six) hours as needed.   Yes Historical Provider, MD  atorvastatin (LIPITOR) 20 MG tablet Take 1 tablet (20 mg total) by mouth daily. 12/23/15  Yes Pincus Sanes, MD  Multiple Vitamins-Minerals (AIRBORNE) TBEF Take 1 tablet by mouth daily as needed (COLD).   Yes Historical Provider, MD    Physical Exam: Vitals:   02/04/16 2100 02/04/16 2115 02/04/16 2122 02/04/16 2130  BP: 135/76 130/73  137/74  Pulse: 71  66  70  Resp: 25 15  22   Temp:   97.6 F (36.4 C)   TempSrc:      SpO2: 99% 100%  99%      Constitutional: NAD, calm, comfortable. Obese. Eyes: PERTLA, lids and conjunctivae normal ENMT: Mucous membranes are moist. Posterior pharynx clear of any exudate or lesions.   Neck: normal, supple, no masses, no thyromegaly Respiratory: clear to auscultation bilaterally, no wheezing, no crackles. Normal respiratory effort.   Cardiovascular: S1 & S2 heard, regular rate and rhythm. No extremity edema. No significant JVD. Abdomen: No distension,  no tenderness, no masses palpated. Bowel sounds normal.  Musculoskeletal: no clubbing / cyanosis. No joint deformity upper and lower extremities. Normal muscle tone.  Skin: no significant rashes, lesions, ulcers. Warm, dry, well-perfused. Neurologic: CN 2-12 grossly intact. Sensation intact, DTR normal. Strength 5/5 in all 4 limbs.  Psychiatric: Normal judgment and insight. Alert and oriented x 3. Normal mood and affect.     Labs on Admission: I have personally reviewed following labs and imaging studies  CBC:  Recent Labs Lab 02/04/16 1743 02/04/16 1758  WBC 8.8  --   NEUTROABS 5.8  --   HGB 15.6 15.6  HCT 45.0 46.0  MCV 95.1  --   PLT 184  --    Basic Metabolic Panel:  Recent Labs Lab 02/04/16 1743 02/04/16 1758  NA 140 140  K 4.5 4.4  CL 103 103  CO2 25  --   GLUCOSE 103* 101*  BUN 17 20  CREATININE 1.05 1.10  CALCIUM 9.3  --    GFR: CrCl cannot be calculated (Unknown ideal weight.). Liver Function Tests:  Recent Labs Lab 02/04/16 1743  AST 29  ALT 29  ALKPHOS 84  BILITOT 0.9  PROT 6.9  ALBUMIN 4.1   No results for input(s): LIPASE, AMYLASE in the last 168 hours. No results for input(s): AMMONIA in the last 168 hours. Coagulation Profile:  Recent Labs Lab 02/04/16 1743  INR 1.05   Cardiac Enzymes: No results for input(s): CKTOTAL, CKMB, CKMBINDEX, TROPONINI in the last 168 hours. BNP (last 3 results) No results for input(s): PROBNP in the last 8760 hours. HbA1C: No results for input(s): HGBA1C in the last 72 hours. CBG: No results for input(s): GLUCAP in the last 168 hours. Lipid Profile: No results for input(s): CHOL, HDL, LDLCALC, TRIG, CHOLHDL, LDLDIRECT in the last 72 hours. Thyroid Function Tests: No results for input(s): TSH, T4TOTAL, FREET4, T3FREE, THYROIDAB in the last 72 hours. Anemia Panel: No results for input(s): VITAMINB12, FOLATE, FERRITIN, TIBC, IRON, RETICCTPCT in the last 72 hours. Urine analysis: No results found for:  COLORURINE, APPEARANCEUR, LABSPEC, PHURINE, GLUCOSEU, HGBUR, BILIRUBINUR, KETONESUR, PROTEINUR, UROBILINOGEN, NITRITE, LEUKOCYTESUR Sepsis Labs: @LABRCNTIP (procalcitonin:4,lacticidven:4) )No results found for this or any previous visit (from the past 240 hour(s)).   Radiological Exams on Admission: Ct Head Wo Contrast  Result Date: 02/04/2016 CLINICAL DATA:  Acute onset of gait problems; patient pulling to the right side. Mild dizziness and tinnitus, chronic in nature. Initial encounter. EXAM: CT HEAD WITHOUT CONTRAST TECHNIQUE: Contiguous axial images were obtained from the base of the skull through the vertex without intravenous contrast. COMPARISON:  None. FINDINGS: Brain: No evidence of acute infarction, hemorrhage, hydrocephalus, extra-axial collection or mass lesion/mass effect. Prominence of the ventricles and sulci suggests mild cortical volume loss. The brainstem and fourth ventricle are within normal limits. The basal ganglia are unremarkable in appearance. The cerebral hemispheres demonstrate grossly normal gray-white differentiation. No mass effect or  midline shift is seen. Vascular: No hyperdense vessel or unexpected calcification. Skull: There is no evidence of fracture; visualized osseous structures are unremarkable in appearance. Sinuses/Orbits: The orbits are within normal limits. The paranasal sinuses and mastoid air cells are well-aerated. Other: No significant soft tissue abnormalities are seen. IMPRESSION: 1. No acute intracranial pathology seen on CT. 2. Mild cortical volume loss noted. Electronically Signed   By: Roanna Raider M.D.   On: 02/04/2016 18:15   Mr Brain Wo Contrast (neuro Protocol)  Result Date: 02/04/2016 CLINICAL DATA:  71 y/o  M; abnormal gait. EXAM: MRI HEAD WITHOUT CONTRAST TECHNIQUE: Multiplanar, multiecho pulse sequences of the brain and surrounding structures were obtained without intravenous contrast. COMPARISON:  02/04/2016 CT head. FINDINGS: Brain: No acute  infarction, hemorrhage, hydrocephalus, extra-axial collection or mass lesion. Few foci of T2 FLAIR hyperintense signal abnormality in white matter are nonspecific but consistent with mild chronic microvascular ischemic change. Mild brain parenchymal volume loss. Vascular: Normal flow voids. Skull and upper cervical spine: Normal marrow signal. Sinuses/Orbits: Negative. Other: None. IMPRESSION: 1. No acute intracranial abnormality. 2. Mild chronic microvascular ischemic changes and parenchymal volume loss of the brain. Electronically Signed   By: Mitzi Hansen M.D.   On: 02/04/2016 22:26    EKG: Independently reviewed. Sinus rhythm, late R-transition  Assessment/Plan  1. Transient gait disturbance  - Pt presents following acute gait disturbance, described as leaning off to the right  - He denies dizziness, "room-spinning," hearing change, or nausea and never experienced this before - CT head and MRI brain negative for acute intracranial abnormality; vitals stable, labs unremarkable  - Primary concern is for TIA, likely posterior-circulation, and neurology was consulted  - Appreciate neurology consultation, will follow-up on recommendations  - Plan to start ASA for prophylaxis, monitor on telemetry with frequent neuro checks, obtain TTE, fasting lipids, A1c; will await neuro recs for further imaging   2. Hyperlipidemia  - Managed with Lipitor at home, will continue  - Fasting lipid panel in am    DVT prophylaxis: sq Lovenox  Code Status: Full  Family Communication: Wife updated at bedside Disposition Plan: Observe on telemetry Consults called: Neurology Admission status: Observation    Briscoe Deutscher, MD [facility] Pager [phone]  If 7PM-7AM, please contact night-coverage [email or web address] Password [email or web address]  02/04/2016, 11:45 PM

## 2016-02-04 NOTE — Consult Note (Signed)
NEURO HOSPITALIST CONSULT NOTE   Requestig physician: Dr. Eliseo Squires  Reason for Consult: Acute onset of gait instability  History obtained from:  Patient     HPI:                                                                                                                                          David Allen is an 71 y.o. male with no prior history of stroke or MI who presents with acute onset of gait instability with veering to the right. He also felt like his "gyroscopes were off" but without a sensation of vertigo, just an impaired sense of balance and position. He denies having any weakness with the symptoms. Had some associated ringing in his ears intermittently. No confusion, sensory numbness, headache, vomiting or speech deficit. Has had no recent fever or other symptoms of illness. No prior history of gait instability. Symptoms began at 1530. After a period of time in the ED, the patient's symptoms resolved. MRI brain was negative for acute infarction.   Past Medical History:  Diagnosis Date  . Hyperlipidemia     Past Surgical History:  Procedure Laterality Date  . APPENDECTOMY  01/06/1995   in [place]  . COLONOSCOPY  2003   Negative ;[facility]  . HERNIA REPAIR  1985  . [place]     Dr.Sypher  . SKIN CANCER EXCISION  2013   [place]  . TONSILLECTOMY      Family History  Problem Relation Age of Onset  . Lung cancer Father     smoker  . Heart attack Father 88  . Dementia Mother   . Dementia Maternal Grandmother     ? Alzheimers  . Breast cancer Other     Maternal Cousin-double Mastectomy   . Diabetes Neg Hx   . Stroke Neg Hx    Social History:  reports that he quit smoking about 40 years ago. He does not have any smokeless tobacco history on file. He reports that he drinks alcohol. He reports that he does not use drugs.  No Known Allergies  HOME MEDICATIONS:  Prior to Admission:  Prescriptions Prior to Admission  Medication Sig Dispense Refill Last Dose  . acetaminophen (TYLENOL) 500 MG tablet Take 1,000 mg by mouth every 6 (six) hours as needed.   Past Week at Unknown time  . atorvastatin (LIPITOR) 20 MG tablet Take 1 tablet (20 mg total) by mouth daily. 90 tablet 3 02/03/2016 at Unknown time  . Multiple Vitamins-Minerals (AIRBORNE) TBEF Take 1 tablet by mouth daily as needed (COLD).   Past Month at Unknown time    ROS:                                                                                                                                       History obtained from patient. As per HPI.   Blood pressure 137/74, pulse 70, temperature 97.6 F (36.4 C), resp. rate 22, SpO2 99 %.  General Examination:                                                                                                      HEENT-  Normocephalic/atraumatic.  Lungs- Respirations unlabored. No gross wheezing.  Extremities- Warm and well-perfused.   Neurological Examination Mental Status: Alert, oriented, thought content appropriate.  Speech fluent without evidence of aphasia.  Able to follow all commands without difficulty. Good fund of knowledge. Cranial Nerves: II: Visual fields intact to confrontation, PERRL III,IV, VI: ptosis not present, EOMI without nystagmus V,VII: smile symmetric, facial temperature sensation normal bilaterally VIII: hearing intact to conversation IX,X: No hypophonia or nasal speech XI: bilateral shoulder shrug symmetric  XII: midline tongue extension Motor: Right : Upper extremity   5/5    Left:     Upper extremity   5/5  Lower extremity   5/5     Lower extremity   5/5 Normal tone throughout; no atrophy noted Sensory: Temperature and light touch intact throughout, bilaterally. No extinction. Deep Tendon Reflexes: Hypoactive lower extremity reflexes, normoactive upper extremity reflexes.  Toes mute.  Cerebellar:  No ataxia with FNF bilaterally.  Gait: Normal gait and station. Does not rotate to right or left when marching in place with eyes closed.   Lab Results: Basic Metabolic Panel:  Recent Labs Lab 02/04/16 1743 02/04/16 1758  NA 140 140  K 4.5 4.4  CL 103 103  CO2 25  --   GLUCOSE 103* 101*  BUN 17 20  CREATININE 1.05 1.10  CALCIUM 9.3  --     Liver Function Tests:  Recent Labs Lab 02/04/16 1743  AST 29  ALT 29  ALKPHOS 84  BILITOT 0.9  PROT 6.9  ALBUMIN 4.1   No results for input(s): LIPASE, AMYLASE in the last 168 hours. No results for input(s): AMMONIA in the last 168 hours.  CBC:  Recent Labs Lab 02/04/16 1743 02/04/16 1758  WBC 8.8  --   NEUTROABS 5.8  --   HGB 15.6 15.6  HCT 45.0 46.0  MCV 95.1  --   PLT 184  --     Cardiac Enzymes: No results for input(s): CKTOTAL, CKMB, CKMBINDEX, TROPONINI in the last 168 hours.  Lipid Panel: No results for input(s): CHOL, TRIG, HDL, CHOLHDL, VLDL, LDLCALC in the last 168 hours.  CBG: No results for input(s): GLUCAP in the last 168 hours.  Microbiology: Results for orders placed or performed during the hospital encounter of 12/04/12  Culture, Group A Strep     Status: None   Collection Time: 12/04/12  9:51 AM  Result Value Ref Range Status   Specimen Description THROAT  Final   Special Requests NONE  Final   Culture   Final    No Beta Hemolytic Streptococci Isolated Performed at [facility]   Report Status 12/06/2012 FINAL  Final    Coagulation Studies:  Recent Labs  02/04/16 1743  LABPROT 13.7  INR 1.05    Imaging: Ct Head Wo Contrast  Result Date: 02/04/2016 CLINICAL DATA:  Acute onset of gait problems; patient pulling to the right side. Mild dizziness and tinnitus, chronic in nature. Initial encounter. EXAM: CT HEAD WITHOUT CONTRAST TECHNIQUE: Contiguous axial images were obtained from the base of the skull through the vertex without intravenous contrast.  COMPARISON:  None. FINDINGS: Brain: No evidence of acute infarction, hemorrhage, hydrocephalus, extra-axial collection or mass lesion/mass effect. Prominence of the ventricles and sulci suggests mild cortical volume loss. The brainstem and fourth ventricle are within normal limits. The basal ganglia are unremarkable in appearance. The cerebral hemispheres demonstrate grossly normal gray-white differentiation. No mass effect or midline shift is seen. Vascular: No hyperdense vessel or unexpected calcification. Skull: There is no evidence of fracture; visualized osseous structures are unremarkable in appearance. Sinuses/Orbits: The orbits are within normal limits. The paranasal sinuses and mastoid air cells are well-aerated. Other: No significant soft tissue abnormalities are seen. IMPRESSION: 1. No acute intracranial pathology seen on CT. 2. Mild cortical volume loss noted. Electronically Signed   By: Garald Balding M.D.   On: 02/04/2016 18:15   Mr Brain Wo Contrast (neuro Protocol)  Result Date: 02/04/2016 CLINICAL DATA:  71 y/o  M; abnormal gait. EXAM: MRI HEAD WITHOUT CONTRAST TECHNIQUE: Multiplanar, multiecho pulse sequences of the brain and surrounding structures were obtained without intravenous contrast. COMPARISON:  02/04/2016 CT head. FINDINGS: Brain: No acute infarction, hemorrhage, hydrocephalus, extra-axial collection or mass lesion. Few foci of T2 FLAIR hyperintense signal abnormality in white matter are nonspecific but consistent with mild chronic microvascular ischemic change. Mild brain parenchymal volume loss. Vascular: Normal flow voids. Skull and upper cervical spine: Normal marrow signal. Sinuses/Orbits: Negative. Other: None. IMPRESSION: 1. No acute intracranial abnormality. 2. Mild chronic microvascular ischemic changes and parenchymal volume loss of the brain. Electronically Signed   By: Kristine Garbe M.D.   On: 02/04/2016 22:26   Assessment: 1. Transient gait instability with  veering to right, now resolved. Most likely secondary to right cerebellar TIA based upon description of symptoms. Did not experience vertigo, therefore peripheral vestibulopathy is unlikely.  2. MRI brain reveals mild chronic microvascular ischemic changes.  No acute abnormality is seen.  3. Hyperlipidemia.   Recommendations: 1. Start ASA 81 mg po qd.  2. Increase atorvastatin to 40 mg po qd.   3. MRA of brain.  4. Carotid ultrasound.  5. TTE.  6. Permissive HTN x 24 hours.  7. PT/OT/Speech. 8. Cardiac telemetry.   Electronically signed: Dr. Kerney Elbe 02/04/2016, 11:06 PM

## 2016-02-04 NOTE — ED Notes (Signed)
Pt presents today with unsteady gait that started around 1530 this date. Pt is unable to walk in a straight line but presents with no other neuro symptoms.

## 2016-02-05 ENCOUNTER — Observation Stay (HOSPITAL_COMMUNITY): Payer: Medicare Other

## 2016-02-05 ENCOUNTER — Encounter (HOSPITAL_COMMUNITY): Payer: Self-pay | Admitting: Radiology

## 2016-02-05 ENCOUNTER — Observation Stay (HOSPITAL_BASED_OUTPATIENT_CLINIC_OR_DEPARTMENT_OTHER): Payer: Medicare Other

## 2016-02-05 DIAGNOSIS — E785 Hyperlipidemia, unspecified: Secondary | ICD-10-CM | POA: Diagnosis not present

## 2016-02-05 DIAGNOSIS — G451 Carotid artery syndrome (hemispheric): Secondary | ICD-10-CM

## 2016-02-05 DIAGNOSIS — I639 Cerebral infarction, unspecified: Secondary | ICD-10-CM | POA: Diagnosis not present

## 2016-02-05 DIAGNOSIS — G459 Transient cerebral ischemic attack, unspecified: Secondary | ICD-10-CM | POA: Diagnosis not present

## 2016-02-05 DIAGNOSIS — R269 Unspecified abnormalities of gait and mobility: Secondary | ICD-10-CM | POA: Diagnosis not present

## 2016-02-05 LAB — LIPID PANEL
CHOL/HDL RATIO: 3.5 ratio
Cholesterol: 145 mg/dL (ref 0–200)
HDL: 41 mg/dL (ref >40)
LDL Cholesterol: 80 mg/dL (ref 0–99)
Triglycerides: 119 mg/dL (ref <150)
VLDL: 24 mg/dL (ref 0–40)

## 2016-02-05 LAB — ECHOCARDIOGRAM COMPLETE
HEIGHTINCHES: 70 in
Weight: 3489.6 oz

## 2016-02-05 MED ORDER — ATORVASTATIN CALCIUM 40 MG PO TABS: 40.0000 mg | ORAL_TABLET | Freq: Every day | ORAL | 0 refills | Status: DC

## 2016-02-05 MED ORDER — ASPIRIN 325 MG PO TABS: 325.0000 mg | ORAL_TABLET | Freq: Every day | ORAL | Status: DC

## 2016-02-05 MED ORDER — IOPAMIDOL (ISOVUE-370) INJECTION 76%
INTRAVENOUS | Status: AC
  Administered 2016-02-05: 50 mL
  Filled 2016-02-05: qty 50

## 2016-02-05 NOTE — Care Management Obs Status (Signed)
[place] NOTIFICATION   Patient Details  Name: BARI DOORLEY MRN: [id number] Date of Birth: [birth year]   Medicare Observation Status Notification Given:  Yes    Pollie Friar, RN 02/05/2016, 12:19 PM

## 2016-02-05 NOTE — Progress Notes (Signed)
  Echocardiogram 2D Echocardiogram has been performed.  Decie Verne 02/05/2016, 2:28 PM

## 2016-02-05 NOTE — Progress Notes (Signed)
STROKE TEAM PROGRESS NOTE   HISTORY OF PRESENT ILLNESS (per record) David Allen is an 71 y.o. male with no prior history of stroke or MI who presents with acute onset of gait instability with veering to the right. He also felt like his "gyroscopes were off" but without a sensation of vertigo, just an impaired sense of balance and position. He denies having any weakness with the symptoms. Had some associated ringing in his ears intermittently. No confusion, sensory numbness, headache, vomiting or speech deficit. Has had no recent fever or other symptoms of illness. No prior history of gait instability. Symptoms began at 1530 02/04/2016 ([facility]). After a period of time in the ED, the patient's symptoms resolved. MRI brain was negative for acute infarction.  Patient was not administered IV t-PA secondary to symptoms resolved. He was admitted for further evaluation and treatment.   SUBJECTIVE (INTERVAL HISTORY) Wife is at bedside. Pt is back to baseline. Stroke work up so far negative. On ASA.   OBJECTIVE Temp:  [97.6 F (36.4 C)-98.6 F (37 C)] 98.6 F (37 C) (01/31 0923) Pulse Rate:  [58-75] 72 (01/31 1131) Cardiac Rhythm: Normal sinus rhythm (01/31 0700) Resp:  [15-25] 16 (01/31 1131) BP: (113-150)/(63-85) 121/63 (01/31 1131) SpO2:  [89 %-100 %] 99 % (01/31 1131) Weight:  [98.9 kg (218 lb 1.6 oz)] 98.9 kg (218 lb 1.6 oz) (01/31 0130)  CBC:  Recent Labs Lab 02/04/16 1743 02/04/16 1758  WBC 8.8  --   NEUTROABS 5.8  --   HGB 15.6 15.6  HCT 45.0 46.0  MCV 95.1  --   PLT 184  --     Basic Metabolic Panel:  Recent Labs Lab 02/04/16 1743 02/04/16 1758  NA 140 140  K 4.5 4.4  CL 103 103  CO2 25  --   GLUCOSE 103* 101*  BUN 17 20  CREATININE 1.05 1.10  CALCIUM 9.3  --     Lipid Panel:    Component Value Date/Time   CHOL 145 02/05/2016 0621   TRIG 119 02/05/2016 0621   TRIG 106 10/21/2005 1019   HDL 41 02/05/2016 0621   CHOLHDL 3.5 02/05/2016 0621   VLDL 24 02/05/2016  0621   LDLCALC 80 02/05/2016 0621   HgbA1c:  Lab Results  Component Value Date   HGBA1C 5.2 10/29/2006   Urine Drug Screen: No results found for: LABOPIA, COCAINSCRNUR, LABBENZ, AMPHETMU, THCU, LABBARB    IMAGING I have personally reviewed the radiological images below and agree with the radiology interpretations.  Ct Head Wo Contrast 02/04/2016 1. No acute intracranial pathology seen on CT. 2. Mild cortical volume loss noted.   Ct Angio Head W Or Wo Contrast Ct Angio Neck W Or Wo Contrast 02/05/2016 1. No acute finding including large vessel occlusion. 2. Carotid atherosclerosis.  No flow limiting stenosis.   Mr Brain Wo Contrast (neuro Protocol) 02/04/2016 1. No acute intracranial abnormality. 2. Mild chronic microvascular ischemic changes and parenchymal volume loss of the brain.   2-D echocardiogram Left ventricle: The cavity size was normal. Wall thickness was   increased in a pattern of mild LVH. Systolic function was normal.   The estimated ejection fraction was in the range of 50% to 55%.   Wall motion was normal; there were no regional wall motion   abnormalities. Features are consistent with a pseudonormal left   ventricular filling pattern, with concomitant abnormal relaxation   and increased filling pressure (grade 2 diastolic dysfunction).  PHYSICAL EXAM  Temp:  [  97.7 F (36.5 C)-98.6 F (37 C)] 98.5 F (36.9 C) (01/31 1456) Pulse Rate:  [58-72] 64 (01/31 1456) Resp:  [16-20] 16 (01/31 1456) BP: (113-148)/(63-83) 117/68 (01/31 1456) SpO2:  [97 %-99 %] 99 % (01/31 1456) Weight:  [218 lb 1.6 oz (98.9 kg)] 218 lb 1.6 oz (98.9 kg) (01/31 0130)  General - Well nourished, well developed, in no apparent distress.  Ophthalmologic - Sharp disc margins OU.   Cardiovascular - Regular rate and rhythm.  Mental Status -  Level of arousal and orientation to time, place, and person were intact. Language including expression, naming, repetition, comprehension was  assessed and found intact. Fund of Knowledge was assessed and was intact.  Cranial Nerves II - XII - II - Visual field intact OU. III, IV, VI - Extraocular movements intact. V - Facial sensation intact bilaterally. VII - Facial movement intact bilaterally. VIII - Hearing & vestibular intact bilaterally. X - Palate elevates symmetrically. XI - Chin turning & shoulder shrug intact bilaterally. XII - Tongue protrusion intact.  Motor Strength - The patient's strength was normal in all extremities and pronator drift was absent.  Bulk was normal and fasciculations were absent.   Motor Tone - Muscle tone was assessed at the neck and appendages and was normal.  Reflexes - The patient's reflexes were 1+ in all extremities and he had no pathological reflexes.  Sensory - Light touch, temperature/pinprick were assessed and were symmetrical.    Coordination - The patient had normal movements in the hands and feet with no ataxia or dysmetria.  Tremor was absent.  Gait and Station - The patient's transfers, posture, gait, station, and turns were observed as normal.   ASSESSMENT/PLAN David Allen is a 71 y.o. male with history of smoking and hyperlipidemia presenting with gait instability and veering to the right. He did not receive IV t-PA due to symptoms resolved.   TIA  CT head no acute abnormality   CTA head and neck no large vessel occlusion. Carotid atherosclerosis.   MRI  no acute stroke.  2D Echo  EF 50-55%   LDL 80  HgbA1c pending  Lovenox 40 mg sq daily for VTE prophylaxis  Diet Heart Room service appropriate? Yes; Fluid consistency: Thin  No antithrombotic prior to admission, now on aspirin 325 mg daily. Continue ASA on discharge.  Patient counseled to be compliant with his antithrombotic medications  Ongoing aggressive stroke risk factor management  Therapy recommendations:  No need  Disposition:  home  Hyperlipidemia  Home meds:  Lipitor 20, resumed in  hospital  LDL 80, goal < 70  Increase lipitor to 40mg   Continue statin at discharge  Other Stroke Risk Factors  Advanced age  ETOH use, advised to drink no more than 2 drink(s) a day  Obesity, Body mass index is 31.29 kg/m., recommend weight loss, diet and exercise as appropriate   Hospital day # 0  Neurology will sign off. Please call with questions. Pt will follow up with Cecille Rubin NP at [facility] in about 6 weeks. Thanks for the consult.  Rosalin Hawking, MD PhD Stroke Neurology 02/06/2016 12:13 AM    To contact Stroke Continuity provider, please refer to [email or web address] After hours, contact General Neurology

## 2016-02-05 NOTE — Discharge Summary (Signed)
Physician Discharge Summary  David Allen UXL:[id number] DOB: 1945/09/23 DOA: 02/04/2016  PCP: Pincus Sanes, MD  Admit date: 02/04/2016 Discharge date: 02/05/2016   Recommendations for Outpatient Follow-Up:   1. LFTs, FLP 6 weeks   Discharge Diagnosis:   Principal Problem:   Gait disturbance Active Problems:   Hyperlipidemia   TIA (transient ischemic attack)   Discharge disposition:  Home.   Discharge Condition: Improved.  Diet recommendation: Low sodium, heart healthy  Wound care: None.   History of Present Illness:   David Allen is a 71 y.o. male with medical history significant only for remote smoking and hyperlipidemia, now presenting to the emergency department for evaluation of an acute gait disturbance. Patient reports that he had been in his usual state of health and was having an uneventful day when at approximately 3:30 PM, he experienced difficulty walking, described as falling to the right. The patient did not actually fall, but required some assistance to avoid that. He denies any sensation of the room spinning or nausea and has never experienced similar symptoms previously. Patient denies any chest pain or palpitations and denies any headaches, change in vision or hearing, or focal numbness or weakness. Patient denies any recent fevers or chills, cough or dyspnea, abdominal pain, vomiting, or diarrhea. His only daily medication is Lipitor and he denies recent use of alcohol or illicit substances. He denies family history of CVA.   Hospital Course by Problem:   TIA  CT head no acute abnormality   CTA head and neck no large vessel occlusion. Carotid atherosclerosis.   MRI  no acute stroke.  2D Echo  EF 50-55%   LDL 80  HgbA1c 5.5  Diet Heart Room service appropriate? Yes; Fluid consistency: Thin  No antithrombotic prior to admission, now on aspirin 325 mg daily. Continue ASA on discharge.   Hyperlipidemia  LDL 80, goal < 70  Increase  lipitor to 40mg      Medical Consultants:    Neurology.   Discharge Exam:   Vitals:   02/05/16 1131 02/05/16 1456  BP: 121/63 117/68  Pulse: 72 64  Resp: 16 16  Temp:  98.5 F (36.9 C)   Vitals:   02/05/16 0730 02/05/16 0923 02/05/16 1131 02/05/16 1456  BP: 137/69 138/83 121/63 117/68  Pulse: 62 65 72 64  Resp: 16 16 16 16   Temp: 98 F (36.7 C) 98.6 F (37 C)  98.5 F (36.9 C)  TempSrc: Oral   Oral  SpO2: 98% 99% 99% 99%  Weight:      Height:        Gen:  NAD    The results of significant diagnostics from this hospitalization (including imaging, microbiology, ancillary and laboratory) are listed below for reference.     Procedures and Diagnostic Studies:   Ct Angio Head W Or Wo Contrast  Result Date: 02/05/2016 CLINICAL DATA:  Acute onset of gait abnormality. EXAM: CT ANGIOGRAPHY HEAD AND NECK TECHNIQUE: Multidetector CT imaging of the head and neck was performed using the standard protocol during bolus administration of intravenous contrast. Multiplanar CT image reconstructions and MIPs were obtained to evaluate the vascular anatomy. Carotid stenosis measurements (when applicable) are obtained utilizing NASCET criteria, using the distal internal carotid diameter as the denominator. CONTRAST:  50 cc Isovue 370 intravenous COMPARISON:  Head CT and brain MRI from yesterday FINDINGS: CTA NECK FINDINGS Aortic arch: Atheromatous wall thickening and calcification that is mild. Four vessel branching. Motion artifact affects visualization of the proximal  left subclavian artery and accounts for the hazy indistinct margins. Along the anterior wall there is likely atherosclerotic plaque that is accentuated by artifact. No suspected dissection. Right carotid system: Calcified and noncalcified plaque at the common carotid bifurcation and bulb without stenosis, ulceration, or dissection. Left carotid system: Predominately noncalcified plaque at the common carotid bifurcation and ICA  bulb without flow limiting stenosis, ulceration, or dissection. Vertebral arteries: Atherosclerosis but no stenosis of the brachiocephalic and right subclavian arteries. The non dominant left vertebral artery arises from the arch. Proximal left vertebral evaluation is limited by a motion artifact, which causes duplicated appearance of this vessel. No suspected vertebral stenosis or dissection. Skeleton: Degenerative changes.  No acute or aggressive finding. Other neck: No incidental mass or adenopathy. Upper chest: Negative Review of the MIP images confirms the above findings CTA HEAD FINDINGS Anterior circulation: The right ICA is larger than the left in the setting of large right posterior communicating artery and aplastic left A1 segment. Mild atherosclerotic calcification on the carotid siphons. No major branch occlusion or flow limiting stenosis. Posterior circulation: Right dominant. The left vertebral artery is miniscule after high the PICA. Aplastic right P1 segment. Shared origin of the left superior cerebellar artery and PCA. Venous sinuses: Patent Anatomic variants: Described above Delayed phase: No abnormal intracranial enhancement. Review of the MIP images confirms the above findings IMPRESSION: 1. No acute finding including large vessel occlusion. 2. Carotid atherosclerosis.  No flow limiting stenosis. Electronically Signed   By: Marnee Spring M.D.   On: 02/05/2016 11:54   Ct Head Wo Contrast  Result Date: 02/04/2016 CLINICAL DATA:  Acute onset of gait problems; patient pulling to the right side. Mild dizziness and tinnitus, chronic in nature. Initial encounter. EXAM: CT HEAD WITHOUT CONTRAST TECHNIQUE: Contiguous axial images were obtained from the base of the skull through the vertex without intravenous contrast. COMPARISON:  None. FINDINGS: Brain: No evidence of acute infarction, hemorrhage, hydrocephalus, extra-axial collection or mass lesion/mass effect. Prominence of the ventricles and  sulci suggests mild cortical volume loss. The brainstem and fourth ventricle are within normal limits. The basal ganglia are unremarkable in appearance. The cerebral hemispheres demonstrate grossly normal gray-white differentiation. No mass effect or midline shift is seen. Vascular: No hyperdense vessel or unexpected calcification. Skull: There is no evidence of fracture; visualized osseous structures are unremarkable in appearance. Sinuses/Orbits: The orbits are within normal limits. The paranasal sinuses and mastoid air cells are well-aerated. Other: No significant soft tissue abnormalities are seen. IMPRESSION: 1. No acute intracranial pathology seen on CT. 2. Mild cortical volume loss noted. Electronically Signed   By: Roanna Raider M.D.   On: 02/04/2016 18:15   Ct Angio Neck W Or Wo Contrast  Result Date: 02/05/2016 CLINICAL DATA:  Acute onset of gait abnormality. EXAM: CT ANGIOGRAPHY HEAD AND NECK TECHNIQUE: Multidetector CT imaging of the head and neck was performed using the standard protocol during bolus administration of intravenous contrast. Multiplanar CT image reconstructions and MIPs were obtained to evaluate the vascular anatomy. Carotid stenosis measurements (when applicable) are obtained utilizing NASCET criteria, using the distal internal carotid diameter as the denominator. CONTRAST:  50 cc Isovue 370 intravenous COMPARISON:  Head CT and brain MRI from yesterday FINDINGS: CTA NECK FINDINGS Aortic arch: Atheromatous wall thickening and calcification that is mild. Four vessel branching. Motion artifact affects visualization of the proximal left subclavian artery and accounts for the hazy indistinct margins. Along the anterior wall there is likely atherosclerotic plaque that is accentuated by  artifact. No suspected dissection. Right carotid system: Calcified and noncalcified plaque at the common carotid bifurcation and bulb without stenosis, ulceration, or dissection. Left carotid system:  Predominately noncalcified plaque at the common carotid bifurcation and ICA bulb without flow limiting stenosis, ulceration, or dissection. Vertebral arteries: Atherosclerosis but no stenosis of the brachiocephalic and right subclavian arteries. The non dominant left vertebral artery arises from the arch. Proximal left vertebral evaluation is limited by a motion artifact, which causes duplicated appearance of this vessel. No suspected vertebral stenosis or dissection. Skeleton: Degenerative changes.  No acute or aggressive finding. Other neck: No incidental mass or adenopathy. Upper chest: Negative Review of the MIP images confirms the above findings CTA HEAD FINDINGS Anterior circulation: The right ICA is larger than the left in the setting of large right posterior communicating artery and aplastic left A1 segment. Mild atherosclerotic calcification on the carotid siphons. No major branch occlusion or flow limiting stenosis. Posterior circulation: Right dominant. The left vertebral artery is miniscule after high the PICA. Aplastic right P1 segment. Shared origin of the left superior cerebellar artery and PCA. Venous sinuses: Patent Anatomic variants: Described above Delayed phase: No abnormal intracranial enhancement. Review of the MIP images confirms the above findings IMPRESSION: 1. No acute finding including large vessel occlusion. 2. Carotid atherosclerosis.  No flow limiting stenosis. Electronically Signed   By: Marnee Spring M.D.   On: 02/05/2016 11:54   Mr Brain Wo Contrast (neuro Protocol)  Result Date: 02/04/2016 CLINICAL DATA:  71 y/o  M; abnormal gait. EXAM: MRI HEAD WITHOUT CONTRAST TECHNIQUE: Multiplanar, multiecho pulse sequences of the brain and surrounding structures were obtained without intravenous contrast. COMPARISON:  02/04/2016 CT head. FINDINGS: Brain: No acute infarction, hemorrhage, hydrocephalus, extra-axial collection or mass lesion. Few foci of T2 FLAIR hyperintense signal  abnormality in white matter are nonspecific but consistent with mild chronic microvascular ischemic change. Mild brain parenchymal volume loss. Vascular: Normal flow voids. Skull and upper cervical spine: Normal marrow signal. Sinuses/Orbits: Negative. Other: None. IMPRESSION: 1. No acute intracranial abnormality. 2. Mild chronic microvascular ischemic changes and parenchymal volume loss of the brain. Electronically Signed   By: Mitzi Hansen M.D.   On: 02/04/2016 22:26     Labs:   Basic Metabolic Panel:  Recent Labs Lab 02/04/16 1743 02/04/16 1758  NA 140 140  K 4.5 4.4  CL 103 103  CO2 25  --   GLUCOSE 103* 101*  BUN 17 20  CREATININE 1.05 1.10  CALCIUM 9.3  --    GFR Estimated Creatinine Clearance: 71.6 mL/min (by C-G formula based on SCr of 1.1 mg/dL). Liver Function Tests:  Recent Labs Lab 02/04/16 1743  AST 29  ALT 29  ALKPHOS 84  BILITOT 0.9  PROT 6.9  ALBUMIN 4.1   No results for input(s): LIPASE, AMYLASE in the last 168 hours. No results for input(s): AMMONIA in the last 168 hours. Coagulation profile  Recent Labs Lab 02/04/16 1743  INR 1.05    CBC:  Recent Labs Lab 02/04/16 1743 02/04/16 1758  WBC 8.8  --   NEUTROABS 5.8  --   HGB 15.6 15.6  HCT 45.0 46.0  MCV 95.1  --   PLT 184  --    Cardiac Enzymes: No results for input(s): CKTOTAL, CKMB, CKMBINDEX, TROPONINI in the last 168 hours. BNP: Invalid input(s): POCBNP CBG: No results for input(s): GLUCAP in the last 168 hours. D-Dimer No results for input(s): DDIMER in the last 72 hours. Hgb A1c No results  for input(s): HGBA1C in the last 72 hours. Lipid Profile  Recent Labs  02/05/16 0621  CHOL 145  HDL 41  LDLCALC 80  TRIG 119  CHOLHDL 3.5   Thyroid function studies No results for input(s): TSH, T4TOTAL, T3FREE, THYROIDAB in the last 72 hours.  Invalid input(s): FREET3 Anemia work up No results for input(s): VITAMINB12, FOLATE, FERRITIN, TIBC, IRON, RETICCTPCT in  the last 72 hours. Microbiology No results found for this or any previous visit (from the past 240 hour(s)).   Discharge Instructions:   Discharge Instructions    Diet - low sodium heart healthy    Complete by:  As directed    Increase activity slowly    Complete by:  As directed      Allergies as of 02/05/2016   No Known Allergies     Medication List    TAKE these medications   acetaminophen 500 MG tablet Commonly known as:  TYLENOL Take 1,000 mg by mouth every 6 (six) hours as needed.   AIRBORNE Tbef Take 1 tablet by mouth daily as needed (COLD).   aspirin 325 MG tablet Take 1 tablet (325 mg total) by mouth daily. Start taking on:  02/06/2016   atorvastatin 40 MG tablet Commonly known as:  LIPITOR Take 1 tablet (40 mg total) by mouth daily. Start taking on:  02/06/2016 What changed:  medication strength  how much to take         Time coordinating discharge: 35 min  Signed:  Lonie Newsham U Kacey Dysert   [facility] 02/05/2016, 4:45 PM

## 2016-02-05 NOTE — Evaluation (Signed)
Physical Therapy Evaluation Patient Details Name: David Allen MRN: [id number] DOB: Jan 22, 1945 Today's Date: 02/05/2016   History of Present Illness  71 y.o. male with medical history significant only for remote smoking and hyperlipidemia who presented to the emergency department for evaluation of an acute gait disturbance.   Clinical Impression  PT eval complete. Yesterday pt felt like he was listing to the right, as if the ground was slanted down on the right. He reports all symptoms have resolved. He is independent with all functional mobility and is back to his baseline. No further PT intervention indicated. PT signing off.    Follow Up Recommendations No PT follow up    Equipment Recommendations  None recommended by PT    Recommendations for Other Services       Precautions / Restrictions Precautions Precautions: None      Mobility  Bed Mobility Overal bed mobility: Independent                Transfers Overall transfer level: Independent Equipment used: None                Ambulation/Gait Ambulation/Gait assistance: Independent Ambulation Distance (Feet): 350 Feet Assistive device: None Gait Pattern/deviations: WFL(Within Functional Limits)   Gait velocity interpretation: >2.62 ft/sec, indicative of independent community ambulator    Stairs Stairs: Yes Stairs assistance: Modified independent (Device/Increase time) Stair Management: One rail Right;Forwards Number of Stairs: 12    Wheelchair Mobility    Modified Rankin (Stroke Patients Only)       Balance Overall balance assessment: Independent                               Standardized Balance Assessment Standardized Balance Assessment : Dynamic Gait Index   Dynamic Gait Index Level Surface: Normal Change in Gait Speed: Normal Gait with Horizontal Head Turns: Normal Gait with Vertical Head Turns: Normal Gait and Pivot Turn: Normal Step Over Obstacle: Normal Step  Around Obstacles: Normal Steps: Mild Impairment Total Score: 23       Pertinent Vitals/Pain Pain Assessment: No/denies pain    Home Living Family/patient expects to be discharged to:: Private residence Living Arrangements: Spouse/significant other Available Help at Discharge: Family;Available 24 hours/day Type of Home: House Home Access: Stairs to enter Entrance Stairs-Rails: Right Entrance Stairs-Number of Steps: 3 Home Layout: One level Home Equipment: None      Prior Function Level of Independence: Independent         Comments: Retired in November. Active. Goes to the gym 3x week.     Hand Dominance        Extremity/Trunk Assessment   Upper Extremity Assessment Upper Extremity Assessment: Overall WFL for tasks assessed    Lower Extremity Assessment Lower Extremity Assessment: Overall WFL for tasks assessed    Cervical / Trunk Assessment Cervical / Trunk Assessment: Normal  Communication   Communication: No difficulties  Cognition Arousal/Alertness: Awake/alert Behavior During Therapy: WFL for tasks assessed/performed Overall Cognitive Status: Within Functional Limits for tasks assessed                      General Comments      Exercises     Assessment/Plan    PT Assessment Patent does not need any further PT services  PT Problem List            PT Treatment Interventions      PT Goals (Current goals can be  found in the Care Plan section)  Acute Rehab PT Goals Patient Stated Goal: home PT Goal Formulation: All assessment and education complete, DC therapy    Frequency     Barriers to discharge        Co-evaluation               End of Session Equipment Utilized During Treatment: Gait belt Activity Tolerance: Patient tolerated treatment well Patient left: Other (comment) (in w/c with transport going to CT) Nurse Communication: Mobility status         Time: 8295-6213 PT Time Calculation (min) (ACUTE ONLY): 14  min   Charges:   PT Evaluation $PT Eval Low Complexity: 1 Procedure     PT G Codes:        Ilda Foil 02/05/2016, 11:15 AM

## 2016-02-05 NOTE — Care Management Note (Signed)
Case Management Note  Patient Details  Name: David Allen MRN: [id number] Date of Birth: [birth year]  Subjective/Objective:             Patient presented with gait disturbance. Lives at home with spouse. CM will follow for discharge needs pending PT/OT evals and physician orders.        Action/Plan:   Expected Discharge Date:                  Expected Discharge Plan:     In-House Referral:     Discharge planning Services     Post Acute Care Choice:    Choice offered to:     DME Arranged:    DME Agency:     HH Arranged:    HH Agency:     Status of Service:     If discussed at [employer] of Stay Meetings, dates discussed:    Additional Comments:  Rolm Baptise, RN 02/05/2016, 10:46 AM

## 2016-02-06 LAB — HEMOGLOBIN A1C
HEMOGLOBIN A1C: 5.5 % (ref 4.8–5.6)
MEAN PLASMA GLUCOSE: 111 mg/dL

## 2016-02-11 ENCOUNTER — Encounter: Payer: Self-pay | Admitting: Neurology

## 2016-02-11 ENCOUNTER — Ambulatory Visit (INDEPENDENT_AMBULATORY_CARE_PROVIDER_SITE_OTHER): Payer: Medicare Other | Admitting: Neurology

## 2016-02-11 VITALS — BP 144/78 | HR 72 | Ht 70.0 in | Wt 213.0 lb

## 2016-02-11 DIAGNOSIS — E782 Mixed hyperlipidemia: Secondary | ICD-10-CM

## 2016-02-11 DIAGNOSIS — R030 Elevated blood-pressure reading, without diagnosis of hypertension: Secondary | ICD-10-CM | POA: Diagnosis not present

## 2016-02-11 DIAGNOSIS — G450 Vertebro-basilar artery syndrome: Secondary | ICD-10-CM | POA: Diagnosis not present

## 2016-02-11 NOTE — Progress Notes (Signed)
NEUROLOGY CONSULTATION NOTE  David Allen MRN: [id number] DOB: [birth year]  Referring provider: Hospital referral Primary care provider: Dr. Lawerance Bach  Reason for consult:  TIA  HISTORY OF PRESENT ILLNESS: David Allen is a 71 year old right-handed male with hyperlipidemia, former smoker and history of skin cancer who presents for transient ischemic attack.  History of symptoms and testing supplemented by hospital notes.  CT head, CTA head and neck and MRI of brain were personally reviewed.  He was admitted to [facility] from 02/04/16 to 02/05/16 for acute onset of gait instability.  He was leaving work and walking to his car when he started to veer towards the right.  There was no associated dizziness, double vision, slurred speech, facial droop, unilateral numbness or weakness or headache.  CT of head revealed no acute abnormality.  MRI of brain demonstrated only mild chronic small vessel ischemic changes but nothing acute.  CTA of head and neck revealed carotid atherosclerosis but no intracranial or extracranial large vessel stenosis or occlusion.  2D echo demonstrated EF 50-55% with no cardiac source of embolus.  Hgb A1c was 5.5.  LDL was 80.  He was advised to start ASA 325mg  daily and his Lipitor was increased from 20mg  to 40mg  daily.  He is doing well.  He plays tennis once a week and goes to the gym 3 times a week.    PAST MEDICAL HISTORY: Past Medical History:  Diagnosis Date  . Cancer (HCC)   . Hyperlipidemia     PAST SURGICAL HISTORY: Past Surgical History:  Procedure Laterality Date  . APPENDECTOMY  01/06/1995   in [place]  . COLONOSCOPY  2003   Negative ;[facility]  . HERNIA REPAIR  1985  . ROTATOR CUFF REPAIR     Dr.Sypher  . SKIN CANCER EXCISION  2013   L temple  . TONSILLECTOMY      MEDICATIONS: Current Outpatient Prescriptions on File Prior to Visit  Medication Sig Dispense Refill  . acetaminophen (TYLENOL) 500 MG tablet Take 1,000 mg by mouth  every 6 (six) hours as needed.    Marland Kitchen aspirin 325 MG tablet Take 1 tablet (325 mg total) by mouth daily.    Marland Kitchen atorvastatin (LIPITOR) 40 MG tablet Take 1 tablet (40 mg total) by mouth daily. 30 tablet 0   No current facility-administered medications on file prior to visit.     ALLERGIES: No Known Allergies  FAMILY HISTORY: Family History  Problem Relation Age of Onset  . Lung cancer Father     smoker  . Heart attack Father 39  . Dementia Mother   . Dementia Maternal Grandmother     ? Alzheimers  . Breast cancer Other     Maternal Cousin-double Mastectomy   . Diabetes Neg Hx   . Stroke Neg Hx     SOCIAL HISTORY: Social History   Social History  . Marital status: Married    Spouse name: N/A  . Number of children: N/A  . Years of education: N/A   Occupational History  . Not on file.   Social History Main Topics  . Smoking status: Former Smoker    Quit date: 01/06/1976  . Smokeless tobacco: Never Used     Comment: smoked 1970-78, up 2 ppd  . Alcohol use Yes     Comment: socially , < 2 drinks / day on average  . Drug use: No  . Sexual activity: Not on file   Other Topics Concern  .  Not on file   Social History Narrative  . No narrative on file    REVIEW OF SYSTEMS: Constitutional: No fevers, chills, or sweats, no generalized fatigue, change in appetite Eyes: No visual changes, double vision, eye pain Ear, nose and throat: No hearing loss, ear pain, nasal congestion, sore throat Cardiovascular: No chest pain, palpitations Respiratory:  No shortness of breath at rest or with exertion, wheezes GastrointestinaI: No nausea, vomiting, diarrhea, abdominal pain, fecal incontinence Genitourinary:  No dysuria, urinary retention or frequency Musculoskeletal:  No neck pain, back pain Integumentary: No rash, pruritus, skin lesions Neurological: as above Psychiatric: No depression, insomnia, anxiety Endocrine: No palpitations, fatigue, diaphoresis, mood swings, change in  appetite, change in weight, increased thirst Hematologic/Lymphatic:  No purpura, petechiae. Allergic/Immunologic: no itchy/runny eyes, nasal congestion, recent allergic reactions, rashes  PHYSICAL EXAM: Vitals:   02/11/16 0946  BP: (!) 144/78  Pulse: 72   General: No acute distress.  Patient appears well-groomed.  Head:  Normocephalic/atraumatic Eyes:  fundi examined but not visualized Neck: supple, no paraspinal tenderness, full range of motion Back: No paraspinal tenderness Heart: regular rate and rhythm Lungs: Clear to auscultation bilaterally. Vascular: No carotid bruits. Neurological Exam: Mental status: alert and oriented to person, place, and time, recent and remote memory intact, fund of knowledge intact, attention and concentration intact, speech fluent and not dysarthric, language intact. Cranial nerves: CN I: not tested CN II: pupils equal, round and reactive to light, visual fields intact CN III, IV, VI:  full range of motion, no nystagmus, no ptosis CN V: facial sensation intact CN VII: upper and lower face symmetric CN VIII: hearing intact CN IX, X: gag intact, uvula midline CN XI: sternocleidomastoid and trapezius muscles intact CN XII: tongue midline Bulk & Tone: normal, no fasciculations. Motor:  5/5 throughout  Sensation:  Pinprick and vibration sensation intact. Deep Tendon Reflexes:  2+ throughout, toes downgoing.  Finger to nose testing:  Without dysmetria.  Heel to shin:  Without dysmetria.  Gait:  Normal station and stride.  Able to turn. Stumbled a couple times but overall able to tandem walk. Romberg negative.  IMPRESSION: Transient ischemic attack, likely involving the posterior cerebral circulation Hyperlipidemia Blood pressure borderline elevated.  Not significant but monitor with PCP.  PLAN: 1.  Continue ASA 325mg  daily for secondary stroke prevention 2.  Continue Lipitor 40mg  daily.  LDL goal should be less than 70. 3.  Mediterranean  diet 4.  Continue exercise 5.  Repeat fasting lipid panel in 6 months with follow up in office soon afterwards.  Thank you for allowing me to take part in the care of this patient.  Shon Millet, DO  CC:  Cheryll Cockayne, MD

## 2016-02-11 NOTE — Patient Instructions (Signed)
1.  Continue aspirin 325mg  daily to help prevent another TIA or stroke 2.  Continue Lipitor 40mg  daily.   3.  Continue exercise 4.  If you think you are having a stroke, then you should call 911.  Use this pneumonic:  F: Facial droop- if one side of face is droopy  A: Arm weakness- if one side of arm or leg is weak or numb  S: Speech difficulty- if you have slurred speech or trouble speaking  T:  Time to call 9-1-1. 5.  Mediterranean diet  Mediterranean Diet A Mediterranean diet refers to food and lifestyle choices that are based on the traditions of countries located on the [employer]. This way of eating has been shown to help prevent certain conditions and improve outcomes for people who have chronic diseases, like kidney disease and heart disease. What are tips for following this plan? Lifestyle  Cook and eat meals together with your family, when possible.  Drink enough fluid to keep your urine clear or pale yellow.  Be physically active every day. This includes:  Aerobic exercise like running or swimming.  Leisure activities like gardening, walking, or housework.  Get 7-8 hours of sleep each night.  If recommended by your health care provider, drink red wine in moderation. This means 1 glass a day for nonpregnant women and 2 glasses a day for men. A glass of wine equals 5 oz (150 mL). Reading food labels  Check the serving size of packaged foods. For foods such as rice and pasta, the serving size refers to the amount of cooked product, not dry.  Check the total fat in packaged foods. Avoid foods that have saturated fat or trans fats.  Check the ingredients list for added sugars, such as corn syrup. Shopping  At the grocery store, buy most of your food from the areas near the walls of the store. This includes:  Fresh fruits and vegetables (produce).  Grains, beans, nuts, and seeds. Some of these may be available in unpackaged forms or large amounts (in  bulk).  Fresh seafood.  Poultry and eggs.  Low-fat dairy products.  Buy whole ingredients instead of prepackaged foods.  Buy fresh fruits and vegetables in-season from local farmers markets.  Buy frozen fruits and vegetables in resealable bags.  If you do not have access to quality fresh seafood, buy precooked frozen shrimp or canned fish, such as tuna, salmon, or sardines.  Buy small amounts of raw or cooked vegetables, salads, or olives from the deli or salad bar at your store.  Stock your pantry so you always have certain foods on hand, such as olive oil, canned tuna, canned tomatoes, rice, pasta, and beans. Cooking  Cook foods with extra-virgin olive oil instead of using butter or other vegetable oils.  Have meat as a side dish, and have vegetables or grains as your main dish. This means having meat in small portions or adding small amounts of meat to foods like pasta or stew.  Use beans or vegetables instead of meat in common dishes like chili or lasagna.  Experiment with different cooking methods. Try roasting or broiling vegetables instead of steaming or sauteing them.  Add frozen vegetables to soups, stews, pasta, or rice.  Add nuts or seeds for added healthy fat at each meal. You can add these to yogurt, salads, or vegetable dishes.  Marinate fish or vegetables using olive oil, lemon juice, garlic, and fresh herbs. Meal planning  Plan to eat 1 vegetarian meal one  day each week. Try to work up to 2 vegetarian meals, if possible.  Eat seafood 2 or more times a week.  Have healthy snacks readily available, such as:  Vegetable sticks with hummus.  Greek yogurt.  Fruit and nut trail mix.  Eat balanced meals throughout the week. This includes:  Fruit: 2-3 servings a day  Vegetables: 4-5 servings a day  Low-fat dairy: 2 servings a day  Fish, poultry, or lean meat: 1 serving a day  Beans and legumes: 2 or more servings a week  Nuts and seeds: 1-2 servings  a day  Whole grains: 6-8 servings a day  Extra-virgin olive oil: 3-4 servings a day  Limit red meat and sweets to only a few servings a month What are my food choices?  Mediterranean diet  Recommended  Grains: Whole-grain pasta. Brown rice. Bulgar wheat. Polenta. Couscous. Whole-wheat bread. [place].  Vegetables: Artichokes. Beets. Broccoli. Cabbage. Carrots. Eggplant. Green beans. Chard. Kale. Spinach. Onions. Leeks. Peas. Squash. Tomatoes. Peppers. Radishes.  Fruits: Apples. Apricots. Avocado. Berries. Bananas. Cherries. Dates. Figs. Grapes. Lemons. Melon. Oranges. Peaches. Plums. Pomegranate.  Meats and other protein foods: Beans. Almonds. Sunflower seeds. Pine nuts. Peanuts. [place]. Salmon. Scallops. Shrimp. [place]. Tilapia. Clams. Oysters. Eggs.  Dairy: Low-fat milk. Cheese. Greek yogurt.  Beverages: Water. Red wine. Herbal tea.  Fats and oils: Extra virgin olive oil. Avocado oil. Grape seed oil.  Sweets and desserts: [place] yogurt with honey. Baked apples. Poached pears. Trail mix.  Seasoning and other foods: Basil. Cilantro. Coriander. Cumin. Mint. Parsley. Sage. Rosemary. Tarragon. Garlic. Oregano. Thyme. Pepper. Balsalmic vinegar. Tahini. Hummus. Tomato sauce. Olives. Mushrooms.  Limit these  Grains: Prepackaged pasta or rice dishes. Prepackaged cereal with added sugar.  Vegetables: Deep fried potatoes (french fries).  Fruits: Fruit canned in syrup.  Meats and other protein foods: Beef. Pork. Lamb. Poultry with skin. Hot dogs. Berniece Salines.  Dairy: Ice cream. Sour cream. Whole milk.  Beverages: Juice. Sugar-sweetened soft drinks. Beer. Liquor and spirits.  Fats and oils: Butter. Canola oil. Vegetable oil. Beef fat (tallow). Lard.  Sweets and desserts: Cookies. Cakes. Pies. Candy.  Seasoning and other foods: Mayonnaise. Premade sauces and marinades.  The items listed may not be a complete list. Talk with your dietitian about what dietary choices are right for  you. Summary  The Mediterranean diet includes both food and lifestyle choices.  Eat a variety of fresh fruits and vegetables, beans, nuts, seeds, and whole grains.  Limit the amount of red meat and sweets that you eat.  Talk with your health care provider about whether it is safe for you to drink red wine in moderation. This means 1 glass a day for nonpregnant women and 2 glasses a day for men. A glass of wine equals 5 oz (150 mL). This information is not intended to replace advice given to you by your health care provider. Make sure you discuss any questions you have with your health care provider. Document Released: 08/15/2015 Document Revised: 09/17/2015 Document Reviewed: 08/15/2015 Elsevier Interactive Patient Education  2017 [place]. 6.  Follow up in 6 months.  Repeat fasting lipid panel one week prior to follow up.

## 2016-02-13 NOTE — Progress Notes (Signed)
Late entry for missed G-code. Based on review of the evaluation and goals by Lorrin Goodell, PT    07-Feb-2016 1115  PT G-Codes **NOT FOR INPATIENT CLASS**  Functional Assessment Tool Used Clinical judgement based on review of the medical record  Functional Limitation Mobility: Walking and moving around  Mobility: Walking and Moving Around Current Status [id number]) CH  Mobility: Walking and Moving Around Goal Status [id number]) CH  Mobility: Walking and Moving Around Discharge Status [phone]) [place], [place]  [phone] 02/13/2016

## 2016-02-14 ENCOUNTER — Encounter: Payer: Self-pay | Admitting: Internal Medicine

## 2016-02-17 ENCOUNTER — Ambulatory Visit: Payer: Medicare Other | Admitting: Neurology

## 2016-02-24 ENCOUNTER — Encounter: Payer: Self-pay | Admitting: Internal Medicine

## 2016-02-24 MED ORDER — ATORVASTATIN CALCIUM 40 MG PO TABS: 40.0000 mg | ORAL_TABLET | Freq: Every day | ORAL | 1 refills | Status: DC

## 2016-05-12 DIAGNOSIS — L821 Other seborrheic keratosis: Secondary | ICD-10-CM | POA: Diagnosis not present

## 2016-05-12 DIAGNOSIS — L570 Actinic keratosis: Secondary | ICD-10-CM | POA: Diagnosis not present

## 2016-05-12 DIAGNOSIS — Z85828 Personal history of other malignant neoplasm of skin: Secondary | ICD-10-CM | POA: Diagnosis not present

## 2016-05-12 DIAGNOSIS — L578 Other skin changes due to chronic exposure to nonionizing radiation: Secondary | ICD-10-CM | POA: Diagnosis not present

## 2016-05-21 ENCOUNTER — Encounter: Payer: Self-pay | Admitting: Internal Medicine

## 2016-05-21 MED ORDER — ZOSTER VAC RECOMB ADJUVANTED 50 MCG/0.5ML IM SUSR: 0.5000 mL | Freq: Once | INTRAMUSCULAR | 1 refills | Status: AC

## 2016-05-22 ENCOUNTER — Telehealth: Payer: Self-pay | Admitting: Internal Medicine

## 2016-05-22 MED ORDER — ZOSTER VAC RECOMB ADJUVANTED 50 MCG/0.5ML IM SUSR: 0.5000 mL | Freq: Once | INTRAMUSCULAR | 1 refills | Status: AC

## 2016-05-22 NOTE — Telephone Encounter (Signed)
Please refax Shingrix with diagnostic code for medicare part B

## 2016-07-31 ENCOUNTER — Other Ambulatory Visit (INDEPENDENT_AMBULATORY_CARE_PROVIDER_SITE_OTHER): Payer: Medicare Other

## 2016-07-31 DIAGNOSIS — E782 Mixed hyperlipidemia: Secondary | ICD-10-CM | POA: Diagnosis not present

## 2016-07-31 LAB — LIPID PANEL
CHOL/HDL RATIO: 3
Cholesterol: 127 mg/dL (ref 0–200)
HDL: 43.5 mg/dL (ref >39.00)
LDL CALC: 58 mg/dL (ref 0–99)
NonHDL: 83.82
TRIGLYCERIDES: 131 mg/dL (ref 0.0–149.0)
VLDL: 26.2 mg/dL (ref 0.0–40.0)

## 2016-08-03 ENCOUNTER — Other Ambulatory Visit: Payer: Medicare Other

## 2016-08-04 DIAGNOSIS — H2513 Age-related nuclear cataract, bilateral: Secondary | ICD-10-CM | POA: Diagnosis not present

## 2016-08-04 DIAGNOSIS — H5213 Myopia, bilateral: Secondary | ICD-10-CM | POA: Diagnosis not present

## 2016-08-04 DIAGNOSIS — H52223 Regular astigmatism, bilateral: Secondary | ICD-10-CM | POA: Diagnosis not present

## 2016-08-04 DIAGNOSIS — H25042 Posterior subcapsular polar age-related cataract, left eye: Secondary | ICD-10-CM | POA: Diagnosis not present

## 2016-08-04 DIAGNOSIS — H524 Presbyopia: Secondary | ICD-10-CM | POA: Diagnosis not present

## 2016-08-10 ENCOUNTER — Ambulatory Visit: Payer: Medicare Other | Admitting: Neurology

## 2016-08-11 ENCOUNTER — Other Ambulatory Visit: Payer: Self-pay | Admitting: Internal Medicine

## 2016-10-12 DIAGNOSIS — Z23 Encounter for immunization: Secondary | ICD-10-CM | POA: Diagnosis not present

## 2016-11-09 ENCOUNTER — Other Ambulatory Visit: Payer: Self-pay | Admitting: Internal Medicine

## 2016-12-30 DIAGNOSIS — M25562 Pain in left knee: Secondary | ICD-10-CM | POA: Diagnosis not present

## 2017-01-07 DIAGNOSIS — M25562 Pain in left knee: Secondary | ICD-10-CM | POA: Diagnosis not present

## 2017-01-11 DIAGNOSIS — M25562 Pain in left knee: Secondary | ICD-10-CM | POA: Diagnosis not present

## 2017-01-12 ENCOUNTER — Telehealth: Payer: Self-pay

## 2017-01-12 NOTE — Telephone Encounter (Signed)
Per Dr Tomi Likens, cleared for surgery, hold ASA 24 hrs prior, resume day after. Called Vira Agar, surgical coordinator at [facility], [phone], LM on VM requesting fax #

## 2017-01-12 NOTE — Telephone Encounter (Signed)
-----   Message from Cameron Sprang, MD sent at 01/11/2017  1:45 PM EST ----- Ok to hold aspirin for surgery. Thanks  KA  ----- Message ----- From: Clois Comber, CMA Sent: 01/11/2017  11:44 AM To: Pieter Partridge, DO  Dr Tomi Likens- Sherrie with [facility] called. Pt has quad tendon rupture, they're trying to schedule surgery possibly Thursday. They need surgical clearance and OK to d/c ASA. Pt had to r/s his appt in Aug when you were out of the office. Pls advise

## 2017-01-13 NOTE — Telephone Encounter (Signed)
Faxed letter to [facility], Rcvd rqst from  [facility] for most recent OV note, faxed 02/11/16 note

## 2017-01-14 DIAGNOSIS — M66252 Spontaneous rupture of extensor tendons, left thigh: Secondary | ICD-10-CM | POA: Diagnosis not present

## 2017-01-14 DIAGNOSIS — S76112A Strain of left quadriceps muscle, fascia and tendon, initial encounter: Secondary | ICD-10-CM | POA: Diagnosis not present

## 2017-01-14 DIAGNOSIS — G8918 Other acute postprocedural pain: Secondary | ICD-10-CM | POA: Diagnosis not present

## 2017-01-14 DIAGNOSIS — S838X2A Sprain of other specified parts of left knee, initial encounter: Secondary | ICD-10-CM | POA: Diagnosis not present

## 2017-01-14 HISTORY — PX: KNEE SURGERY: SHX244

## 2017-01-15 ENCOUNTER — Ambulatory Visit: Payer: Medicare Other | Admitting: Internal Medicine

## 2017-01-15 ENCOUNTER — Ambulatory Visit: Payer: Medicare Other

## 2017-01-25 DIAGNOSIS — M66252 Spontaneous rupture of extensor tendons, left thigh: Secondary | ICD-10-CM | POA: Diagnosis not present

## 2017-01-29 ENCOUNTER — Ambulatory Visit (INDEPENDENT_AMBULATORY_CARE_PROVIDER_SITE_OTHER): Payer: Medicare Other | Admitting: Neurology

## 2017-01-29 ENCOUNTER — Encounter: Payer: Self-pay | Admitting: Neurology

## 2017-01-29 VITALS — BP 128/70 | HR 76 | Ht 70.5 in | Wt 212.8 lb

## 2017-01-29 DIAGNOSIS — G459 Transient cerebral ischemic attack, unspecified: Secondary | ICD-10-CM | POA: Diagnosis not present

## 2017-01-29 NOTE — Patient Instructions (Signed)
1.  Continue aspirin 325mg  daily 2.  Continue atorvastatin daily 3.  Continue exercise 4.  Try to follow Mediterranean diet 5.  Follow up as needed.   Mediterranean Diet A Mediterranean diet refers to food and lifestyle choices that are based on the traditions of countries located on the [employer]. This way of eating has been shown to help prevent certain conditions and improve outcomes for people who have chronic diseases, like kidney disease and heart disease. What are tips for following this plan? Lifestyle  Cook and eat meals together with your family, when possible.  Drink enough fluid to keep your urine clear or pale yellow.  Be physically active every day. This includes: ? Aerobic exercise like running or swimming. ? Leisure activities like gardening, walking, or housework.  Get 7-8 hours of sleep each night.  If recommended by your health care provider, drink red wine in moderation. This means 1 glass a day for nonpregnant women and 2 glasses a day for men. A glass of wine equals 5 oz (150 mL). Reading food labels  Check the serving size of packaged foods. For foods such as rice and pasta, the serving size refers to the amount of cooked product, not dry.  Check the total fat in packaged foods. Avoid foods that have saturated fat or trans fats.  Check the ingredients list for added sugars, such as corn syrup. Shopping  At the grocery store, buy most of your food from the areas near the walls of the store. This includes: ? Fresh fruits and vegetables (produce). ? Grains, beans, nuts, and seeds. Some of these may be available in unpackaged forms or large amounts (in bulk). ? Fresh seafood. ? Poultry and eggs. ? Low-fat dairy products.  Buy whole ingredients instead of prepackaged foods.  Buy fresh fruits and vegetables in-season from local farmers markets.  Buy frozen fruits and vegetables in resealable bags.  If you do not have access to quality fresh  seafood, buy precooked frozen shrimp or canned fish, such as tuna, salmon, or sardines.  Buy small amounts of raw or cooked vegetables, salads, or olives from the deli or salad bar at your store.  Stock your pantry so you always have certain foods on hand, such as olive oil, canned tuna, canned tomatoes, rice, pasta, and beans. Cooking  Cook foods with extra-virgin olive oil instead of using butter or other vegetable oils.  Have meat as a side dish, and have vegetables or grains as your main dish. This means having meat in small portions or adding small amounts of meat to foods like pasta or stew.  Use beans or vegetables instead of meat in common dishes like chili or lasagna.  Experiment with different cooking methods. Try roasting or broiling vegetables instead of steaming or sauteing them.  Add frozen vegetables to soups, stews, pasta, or rice.  Add nuts or seeds for added healthy fat at each meal. You can add these to yogurt, salads, or vegetable dishes.  Marinate fish or vegetables using olive oil, lemon juice, garlic, and fresh herbs. Meal planning  Plan to eat 1 vegetarian meal one day each week. Try to work up to 2 vegetarian meals, if possible.  Eat seafood 2 or more times a week.  Have healthy snacks readily available, such as: ? Vegetable sticks with hummus. ? [place] yogurt. ? Fruit and nut trail mix.  Eat balanced meals throughout the week. This includes: ? Fruit: 2-3 servings a day ? Vegetables: 4-5 servings a  day ? Low-fat dairy: 2 servings a day ? Fish, poultry, or lean meat: 1 serving a day ? Beans and legumes: 2 or more servings a week ? Nuts and seeds: 1-2 servings a day ? Whole grains: 6-8 servings a day ? Extra-virgin olive oil: 3-4 servings a day  Limit red meat and sweets to only a few servings a month What are my food choices?  Mediterranean diet ? Recommended ? Grains: Whole-grain pasta. Brown rice. Bulgar wheat. Polenta. Couscous. Whole-wheat  bread. [place]. ? Vegetables: Artichokes. Beets. Broccoli. Cabbage. Carrots. Eggplant. Green beans. Chard. Kale. Spinach. Onions. Leeks. Peas. Squash. Tomatoes. Peppers. Radishes. ? Fruits: Apples. Apricots. Avocado. Berries. Bananas. Cherries. Dates. Figs. Grapes. Lemons. Melon. Oranges. Peaches. Plums. Pomegranate. ? Meats and other protein foods: Beans. Almonds. Sunflower seeds. Pine nuts. Peanuts. [place]. Salmon. Scallops. Shrimp. [place]. Tilapia. Clams. Oysters. Eggs. ? Dairy: Low-fat milk. Cheese. Greek yogurt. ? Beverages: Water. Red wine. Herbal tea. ? Fats and oils: Extra virgin olive oil. Avocado oil. Grape seed oil. ? Sweets and desserts: [place] yogurt with honey. Baked apples. Poached pears. Trail mix. ? Seasoning and other foods: Basil. Cilantro. Coriander. Cumin. Mint. Parsley. Sage. Rosemary. Tarragon. Garlic. Oregano. Thyme. Pepper. Balsalmic vinegar. Tahini. Hummus. Tomato sauce. Olives. Mushrooms. ? Limit these ? Grains: Prepackaged pasta or rice dishes. Prepackaged cereal with added sugar. ? Vegetables: Deep fried potatoes (french fries). ? Fruits: Fruit canned in syrup. ? Meats and other protein foods: Beef. Pork. Lamb. Poultry with skin. Hot dogs. Berniece Salines. ? Dairy: Ice cream. Sour cream. Whole milk. ? Beverages: Juice. Sugar-sweetened soft drinks. Beer. Liquor and spirits. ? Fats and oils: Butter. Canola oil. Vegetable oil. Beef fat (tallow). Lard. ? Sweets and desserts: Cookies. Cakes. Pies. Candy. ? Seasoning and other foods: Mayonnaise. Premade sauces and marinades. ? The items listed may not be a complete list. Talk with your dietitian about what dietary choices are right for you. Summary  The Mediterranean diet includes both food and lifestyle choices.  Eat a variety of fresh fruits and vegetables, beans, nuts, seeds, and whole grains.  Limit the amount of red meat and sweets that you eat.  Talk with your health care provider about whether it is safe for you to  drink red wine in moderation. This means 1 glass a day for nonpregnant women and 2 glasses a day for men. A glass of wine equals 5 oz (150 mL). This information is not intended to replace advice given to you by your health care provider. Make sure you discuss any questions you have with your health care provider. Document Released: 08/15/2015 Document Revised: 09/17/2015 Document Reviewed: 08/15/2015 Elsevier Interactive Patient Education  [employer].

## 2017-01-29 NOTE — Progress Notes (Signed)
NEUROLOGY FOLLOW UP OFFICE NOTE  David Allen [id number]  HISTORY OF PRESENT ILLNESS: David Allen is a 72 year old right-handed male with hyperlipidemia, former smoker and history of skin cancer who follows up for transient ischemic attack.   UPDATE: He is on ASA 325mg  daily and Lipitor 40mg  daily.  LDL from 07/31/16 was 58.  He is doing well.  No recurrent events.   HISTORY: He was admitted to [facility] from 02/04/16 to 02/05/16 for acute onset of gait instability.  He was leaving work and walking to his car when he started to veer towards the right.  There was no associated dizziness, double vision, slurred speech, facial droop, unilateral numbness or weakness or headache.  CT of head revealed no acute abnormality.  MRI of brain demonstrated only mild chronic small vessel ischemic changes but nothing acute.  CTA of head and neck revealed carotid atherosclerosis but no intracranial or extracranial large vessel stenosis or occlusion.  2D echo demonstrated EF 50-55% with no cardiac source of embolus.  Hgb A1c was 5.5.  LDL was 80.  He was advised to start ASA 325mg  daily and his Lipitor was increased from 20mg  to 40mg  daily.  He is doing well.   He plays tennis once a week and goes to the gym 3 times a week.    PAST MEDICAL HISTORY: Past Medical History:  Diagnosis Date  . Cancer ([place])   . Hyperlipidemia     MEDICATIONS: Current Outpatient Medications on File Prior to Visit  Medication Sig Dispense Refill  . acetaminophen (TYLENOL) 500 MG tablet Take 1,000 mg by mouth every 6 (six) hours as needed.    Marland Kitchen aspirin 325 MG tablet Take 1 tablet (325 mg total) by mouth daily.    Marland Kitchen atorvastatin (LIPITOR) 40 MG tablet TAKE 1 TABLET (40 MG TOTAL) BY MOUTH DAILY. 90 tablet 0   No current facility-administered medications on file prior to visit.     ALLERGIES: No Known Allergies  FAMILY HISTORY: Family History  Problem Relation Age of Onset  . Lung cancer Father    smoker  . Heart attack Father 59  . Dementia Mother   . Dementia Maternal Grandmother        ? Alzheimers  . Breast cancer Other        Maternal Cousin-double Mastectomy   . Diabetes Neg Hx   . Stroke Neg Hx     SOCIAL HISTORY: Social History   Socioeconomic History  . Marital status: Married    Spouse name: Not on file  . Number of children: Not on file  . Years of education: Not on file  . Highest education level: Not on file  Social Needs  . Financial resource strain: Not on file  . Food insecurity - worry: Not on file  . Food insecurity - inability: Not on file  . Transportation needs - medical: Not on file  . Transportation needs - non-medical: Not on file  Occupational History  . Not on file  Tobacco Use  . Smoking status: Former Smoker    Last attempt to quit: 01/06/1976    Years since quitting: 41.0  . Smokeless tobacco: Never Used  . Tobacco comment: smoked 1970-78, up 2 ppd  Substance and Sexual Activity  . Alcohol use: Yes    Comment: socially , < 2 drinks / day on average  . Drug use: No  . Sexual activity: Not on file  Other Topics Concern  . Not on  file  Social History Narrative  . Not on file    REVIEW OF SYSTEMS: Constitutional: No fevers, chills, or sweats, no generalized fatigue, change in appetite Eyes: No visual changes, double vision, eye pain Ear, nose and throat: No hearing loss, ear pain, nasal congestion, sore throat Cardiovascular: No chest pain, palpitations Respiratory:  No shortness of breath at rest or with exertion, wheezes GastrointestinaI: No nausea, vomiting, diarrhea, abdominal pain, fecal incontinence Genitourinary:  No dysuria, urinary retention or frequency Musculoskeletal:  No neck pain, back pain Integumentary: No rash, pruritus, skin lesions Neurological: as above Psychiatric: No depression, insomnia, anxiety Endocrine: No palpitations, fatigue, diaphoresis, mood swings, change in appetite, change in weight, increased  thirst Hematologic/Lymphatic:  No purpura, petechiae. Allergic/Immunologic: no itchy/runny eyes, nasal congestion, recent allergic reactions, rashes  PHYSICAL EXAM: Vitals:   01/29/17 1114  BP: 128/70  Pulse: 76  SpO2: 97%   General: No acute distress.  Patient appears well-groomed.   Head:  Normocephalic/atraumatic Eyes:  Fundi examined but not visualized Neck: supple, no paraspinal tenderness, full range of motion Heart:  Regular rate and rhythm Lungs:  Clear to auscultation bilaterally Back: No paraspinal tenderness Neurological Exam: alert and oriented to person, place, and time. Attention span and concentration intact, recent and remote memory intact, fund of knowledge intact.  Speech fluent and not dysarthric, language intact.  CN II-XII intact. Bulk and tone normal, muscle strength 5/5 throughout.  Sensation to light touch  intact.  Deep tendon reflexes 2+ throughout.  Finger to nose testing intact.  Gait normal, Romberg negative.   IMPRESSION: Transient ischemic attack, likely involving the posterior cerebral circulation  PLAN: 1.  Continue ASA 325mg  daily for secondary stroke prevention 2.  Continue Lipitor 40mg  daily as managed by PCP.  LDL at goal of less than 70. 3.  Mediterranean diet 4.  Continue exercise 5.  Follow up as needed.  15 minutes spent face to face with patient, over 50% spent discussing management.  Metta Clines, DO  CC: David Gosling, MD

## 2017-02-01 ENCOUNTER — Other Ambulatory Visit: Payer: Self-pay | Admitting: Emergency Medicine

## 2017-02-01 MED ORDER — ATORVASTATIN CALCIUM 40 MG PO TABS: 40.0000 mg | ORAL_TABLET | Freq: Every day | ORAL | 0 refills | Status: AC

## 2017-02-16 DIAGNOSIS — L853 Xerosis cutis: Secondary | ICD-10-CM | POA: Diagnosis not present

## 2017-02-16 DIAGNOSIS — Z85828 Personal history of other malignant neoplasm of skin: Secondary | ICD-10-CM | POA: Diagnosis not present

## 2017-02-16 DIAGNOSIS — L570 Actinic keratosis: Secondary | ICD-10-CM | POA: Diagnosis not present

## 2017-02-16 DIAGNOSIS — L821 Other seborrheic keratosis: Secondary | ICD-10-CM | POA: Diagnosis not present

## 2017-02-16 DIAGNOSIS — L812 Freckles: Secondary | ICD-10-CM | POA: Diagnosis not present

## 2017-02-18 ENCOUNTER — Encounter: Payer: Self-pay | Admitting: Internal Medicine

## 2017-02-18 DIAGNOSIS — G459 Transient cerebral ischemic attack, unspecified: Secondary | ICD-10-CM | POA: Diagnosis not present

## 2017-02-18 DIAGNOSIS — E7800 Pure hypercholesterolemia, unspecified: Secondary | ICD-10-CM | POA: Diagnosis not present

## 2017-02-18 DIAGNOSIS — Z6829 Body mass index (BMI) 29.0-29.9, adult: Secondary | ICD-10-CM | POA: Diagnosis not present

## 2017-02-18 DIAGNOSIS — Z1211 Encounter for screening for malignant neoplasm of colon: Secondary | ICD-10-CM | POA: Diagnosis not present

## 2017-02-18 DIAGNOSIS — Z1389 Encounter for screening for other disorder: Secondary | ICD-10-CM | POA: Diagnosis not present

## 2017-02-22 DIAGNOSIS — M66252 Spontaneous rupture of extensor tendons, left thigh: Secondary | ICD-10-CM | POA: Diagnosis not present

## 2017-02-24 DIAGNOSIS — M25562 Pain in left knee: Secondary | ICD-10-CM | POA: Diagnosis not present

## 2017-02-24 DIAGNOSIS — M66252 Spontaneous rupture of extensor tendons, left thigh: Secondary | ICD-10-CM | POA: Diagnosis not present

## 2017-02-24 DIAGNOSIS — M25662 Stiffness of left knee, not elsewhere classified: Secondary | ICD-10-CM | POA: Diagnosis not present

## 2017-03-01 DIAGNOSIS — M66252 Spontaneous rupture of extensor tendons, left thigh: Secondary | ICD-10-CM | POA: Diagnosis not present

## 2017-03-01 DIAGNOSIS — M25662 Stiffness of left knee, not elsewhere classified: Secondary | ICD-10-CM | POA: Diagnosis not present

## 2017-03-01 DIAGNOSIS — M25562 Pain in left knee: Secondary | ICD-10-CM | POA: Diagnosis not present

## 2017-03-03 DIAGNOSIS — M25662 Stiffness of left knee, not elsewhere classified: Secondary | ICD-10-CM | POA: Diagnosis not present

## 2017-03-03 DIAGNOSIS — M66252 Spontaneous rupture of extensor tendons, left thigh: Secondary | ICD-10-CM | POA: Diagnosis not present

## 2017-03-03 DIAGNOSIS — M25562 Pain in left knee: Secondary | ICD-10-CM | POA: Diagnosis not present

## 2017-03-08 DIAGNOSIS — M25662 Stiffness of left knee, not elsewhere classified: Secondary | ICD-10-CM | POA: Diagnosis not present

## 2017-03-08 DIAGNOSIS — M25562 Pain in left knee: Secondary | ICD-10-CM | POA: Diagnosis not present

## 2017-03-08 DIAGNOSIS — M66252 Spontaneous rupture of extensor tendons, left thigh: Secondary | ICD-10-CM | POA: Diagnosis not present

## 2017-03-10 ENCOUNTER — Other Ambulatory Visit: Payer: Self-pay | Admitting: Internal Medicine

## 2017-03-11 DIAGNOSIS — M66252 Spontaneous rupture of extensor tendons, left thigh: Secondary | ICD-10-CM | POA: Diagnosis not present

## 2017-03-11 DIAGNOSIS — M25662 Stiffness of left knee, not elsewhere classified: Secondary | ICD-10-CM | POA: Diagnosis not present

## 2017-03-11 DIAGNOSIS — M25562 Pain in left knee: Secondary | ICD-10-CM | POA: Diagnosis not present

## 2017-03-16 DIAGNOSIS — M66252 Spontaneous rupture of extensor tendons, left thigh: Secondary | ICD-10-CM | POA: Diagnosis not present

## 2017-03-16 DIAGNOSIS — M25562 Pain in left knee: Secondary | ICD-10-CM | POA: Diagnosis not present

## 2017-03-16 DIAGNOSIS — M25662 Stiffness of left knee, not elsewhere classified: Secondary | ICD-10-CM | POA: Diagnosis not present

## 2017-03-18 DIAGNOSIS — M66252 Spontaneous rupture of extensor tendons, left thigh: Secondary | ICD-10-CM | POA: Diagnosis not present

## 2017-03-18 DIAGNOSIS — M25662 Stiffness of left knee, not elsewhere classified: Secondary | ICD-10-CM | POA: Diagnosis not present

## 2017-03-18 DIAGNOSIS — M25562 Pain in left knee: Secondary | ICD-10-CM | POA: Diagnosis not present

## 2017-03-19 DIAGNOSIS — S838X2D Sprain of other specified parts of left knee, subsequent encounter: Secondary | ICD-10-CM | POA: Diagnosis not present

## 2017-03-22 DIAGNOSIS — M25662 Stiffness of left knee, not elsewhere classified: Secondary | ICD-10-CM | POA: Diagnosis not present

## 2017-03-22 DIAGNOSIS — M25562 Pain in left knee: Secondary | ICD-10-CM | POA: Diagnosis not present

## 2017-03-22 DIAGNOSIS — M66252 Spontaneous rupture of extensor tendons, left thigh: Secondary | ICD-10-CM | POA: Diagnosis not present

## 2017-03-25 ENCOUNTER — Ambulatory Visit (AMBULATORY_SURGERY_CENTER): Payer: Self-pay | Admitting: *Deleted

## 2017-03-25 ENCOUNTER — Other Ambulatory Visit: Payer: Self-pay

## 2017-03-25 VITALS — Ht 70.0 in | Wt 207.4 lb

## 2017-03-25 DIAGNOSIS — Z1211 Encounter for screening for malignant neoplasm of colon: Secondary | ICD-10-CM

## 2017-03-25 DIAGNOSIS — M25562 Pain in left knee: Secondary | ICD-10-CM | POA: Diagnosis not present

## 2017-03-25 DIAGNOSIS — M25662 Stiffness of left knee, not elsewhere classified: Secondary | ICD-10-CM | POA: Diagnosis not present

## 2017-03-25 DIAGNOSIS — M66252 Spontaneous rupture of extensor tendons, left thigh: Secondary | ICD-10-CM | POA: Diagnosis not present

## 2017-03-25 NOTE — Progress Notes (Signed)
No egg or soy allergy known to patient  No issues with past sedation with any surgeries  or procedures, no intubation problems  No diet pills per patient No home 02 use per patient  No blood thinners per patient  Pt denies issues with constipation  No A fib or A flutter  EMMI video sent to pt's e mail  

## 2017-03-29 DIAGNOSIS — M25662 Stiffness of left knee, not elsewhere classified: Secondary | ICD-10-CM | POA: Diagnosis not present

## 2017-03-29 DIAGNOSIS — M25562 Pain in left knee: Secondary | ICD-10-CM | POA: Diagnosis not present

## 2017-03-29 DIAGNOSIS — M66252 Spontaneous rupture of extensor tendons, left thigh: Secondary | ICD-10-CM | POA: Diagnosis not present

## 2017-03-31 DIAGNOSIS — Z1389 Encounter for screening for other disorder: Secondary | ICD-10-CM | POA: Diagnosis not present

## 2017-03-31 DIAGNOSIS — M25662 Stiffness of left knee, not elsewhere classified: Secondary | ICD-10-CM | POA: Diagnosis not present

## 2017-03-31 DIAGNOSIS — G459 Transient cerebral ischemic attack, unspecified: Secondary | ICD-10-CM | POA: Diagnosis not present

## 2017-03-31 DIAGNOSIS — E7800 Pure hypercholesterolemia, unspecified: Secondary | ICD-10-CM | POA: Diagnosis not present

## 2017-03-31 DIAGNOSIS — R7309 Other abnormal glucose: Secondary | ICD-10-CM | POA: Diagnosis not present

## 2017-03-31 DIAGNOSIS — Z125 Encounter for screening for malignant neoplasm of prostate: Secondary | ICD-10-CM | POA: Diagnosis not present

## 2017-03-31 DIAGNOSIS — M66252 Spontaneous rupture of extensor tendons, left thigh: Secondary | ICD-10-CM | POA: Diagnosis not present

## 2017-03-31 DIAGNOSIS — M25562 Pain in left knee: Secondary | ICD-10-CM | POA: Diagnosis not present

## 2017-03-31 DIAGNOSIS — Z0000 Encounter for general adult medical examination without abnormal findings: Secondary | ICD-10-CM | POA: Diagnosis not present

## 2017-04-04 DIAGNOSIS — I639 Cerebral infarction, unspecified: Secondary | ICD-10-CM | POA: Insufficient documentation

## 2017-04-08 ENCOUNTER — Other Ambulatory Visit: Payer: Self-pay

## 2017-04-08 ENCOUNTER — Ambulatory Visit (AMBULATORY_SURGERY_CENTER): Payer: Medicare Other | Admitting: Internal Medicine

## 2017-04-08 ENCOUNTER — Encounter: Payer: Self-pay | Admitting: Internal Medicine

## 2017-04-08 VITALS — BP 103/68 | HR 65 | Temp 97.7°F | Resp 19 | Ht 70.0 in | Wt 207.0 lb

## 2017-04-08 DIAGNOSIS — Z1211 Encounter for screening for malignant neoplasm of colon: Secondary | ICD-10-CM | POA: Diagnosis present

## 2017-04-08 DIAGNOSIS — D124 Benign neoplasm of descending colon: Secondary | ICD-10-CM | POA: Diagnosis not present

## 2017-04-08 DIAGNOSIS — D122 Benign neoplasm of ascending colon: Secondary | ICD-10-CM | POA: Diagnosis not present

## 2017-04-08 MED ORDER — SODIUM CHLORIDE 0.9 % IV SOLN
500.0000 mL | Freq: Once | INTRAVENOUS | Status: AC
Start: 2017-04-08 — End: ?

## 2017-04-08 NOTE — Op Note (Signed)
[facility] Patient Name: David Allen Procedure Date: 04/08/2017 1:26 PM MRN: [id number] Endoscopist: Iva Boop , MD Age: 72 Referring MD:  Date of Birth: 1945/07/29 Gender: Male Account #: [id number] Procedure:                Colonoscopy Indications:              Screening for colorectal malignant neoplasm, Last                            colonoscopy: 2003 Medicines:                Propofol per Anesthesia, Monitored Anesthesia Care Procedure:                Pre-Anesthesia Assessment:                           - Prior to the procedure, a History and Physical                            was performed, and patient medications and                            allergies were reviewed. The patient's tolerance of                            previous anesthesia was also reviewed. The risks                            and benefits of the procedure and the sedation                            options and risks were discussed with the patient.                            All questions were answered, and informed consent                            was obtained. Prior Anticoagulants: The patient has                            taken no previous anticoagulant or antiplatelet                            agents. ASA Grade Assessment: II - A patient with                            mild systemic disease. After reviewing the risks                            and benefits, the patient was deemed in                            satisfactory condition to undergo the procedure.  After obtaining informed consent, the colonoscope                            was passed under direct vision. Throughout the                            procedure, the patient's blood pressure, pulse, and                            oxygen saturations were monitored continuously. The                            Colonoscope was introduced through the anus and                            advanced to the the  cecum, identified by                            appendiceal orifice and ileocecal valve. The                            colonoscopy was performed without difficulty. The                            patient tolerated the procedure well. The quality                            of the bowel preparation was excellent. The                            ileocecal valve, appendiceal orifice, and rectum                            were photographed. The bowel preparation used was                            Miralax. Scope In: 1:45:06 PM Scope Out: 1:57:22 PM Scope Withdrawal Time: 0 hours 9 minutes 32 seconds  Total Procedure Duration: 0 hours 12 minutes 16 seconds  Findings:                 The perianal and digital rectal examinations were                            normal. Pertinent negatives include normal prostate                            (size, shape, and consistency).                           Two sessile polyps were found in the descending                            colon and ascending colon. The polyps were  diminutive in size. These polyps were removed with                            a cold snare. Resection and retrieval were                            complete. Verification of patient identification                            for the specimen was done. Estimated blood loss was                            minimal.                           The exam was otherwise without abnormality on                            direct and retroflexion views. Complications:            No immediate complications. Estimated Blood Loss:     Estimated blood loss was minimal. Impression:               - Two diminutive polyps in the descending colon and                            in the ascending colon, removed with a cold snare.                            Resected and retrieved.                           - The examination was otherwise normal on direct                            and  retroflexion views. Recommendation:           - Patient has a contact number available for                            emergencies. The signs and symptoms of potential                            delayed complications were discussed with the                            patient. Return to normal activities tomorrow.                            Written discharge instructions were provided to the                            patient.                           - Resume previous diet.                           -  Continue present medications.                           - No recommendation at this time regarding repeat                            colonoscopy due to age. May not need another                            routine - will see whatr path tells [place] and decide Iva Boop, MD 04/08/2017 2:04:44 PM This report has been signed electronically.

## 2017-04-08 NOTE — Progress Notes (Signed)
Called to room to assist during endoscopic procedure.  Patient ID and intended procedure confirmed with present staff. Received instructions for my participation in the procedure from the performing physician.  

## 2017-04-08 NOTE — Patient Instructions (Addendum)
   I found and removed 2 tiny polyps.  I will let you know pathology results and if/when to have another routine colonoscopy by mail and/or My Chart. I think repeating a routine colonoscopy in your case may not be needed.  I appreciate the opportunity to care for you. Gatha Mayer, MD, FACG  YOU HAD AN ENDOSCOPIC PROCEDURE TODAY AT [facility]:   Refer to the procedure report that was given to you for any specific questions about what was found during the examination.  If the procedure report does not answer your questions, please call your gastroenterologist to clarify.  If you requested that your care partner not be given the details of your procedure findings, then the procedure report has been included in a sealed envelope for you to review at your convenience later.  YOU SHOULD EXPECT: Some feelings of bloating in the abdomen. Passage of more gas than usual.  Walking can help get rid of the air that was put into your GI tract during the procedure and reduce the bloating. If you had a lower endoscopy (such as a colonoscopy or flexible sigmoidoscopy) you may notice spotting of blood in your stool or on the toilet paper. If you underwent a bowel prep for your procedure, you may not have a normal bowel movement for a few days.  Please Note:  You might notice some irritation and congestion in your nose or some drainage.  This is from the oxygen used during your procedure.  There is no need for concern and it should clear up in a day or so.  SYMPTOMS TO REPORT IMMEDIATELY:   Following lower endoscopy (colonoscopy or flexible sigmoidoscopy):  Excessive amounts of blood in the stool  Significant tenderness or worsening of abdominal pains  Swelling of the abdomen that is new, acute  Fever of 100F or higher  For urgent or emergent issues, a gastroenterologist can be reached at any hour by calling [phone].   DIET:  We do recommend a small meal at first, but then  you may proceed to your regular diet.  Drink plenty of fluids but you should avoid alcoholic beverages for 24 hours.  ACTIVITY:  You should plan to take it easy for the rest of today and you should NOT DRIVE or use heavy machinery until tomorrow (because of the sedation medicines used during the test).    FOLLOW UP: Our staff will call the number listed on your records the next business day following your procedure to check on you and address any questions or concerns that you may have regarding the information given to you following your procedure. If we do not reach you, we will leave a message.  However, if you are feeling well and you are not experiencing any problems, there is no need to return our call.  We will assume that you have returned to your regular daily activities without incident.  If any biopsies were taken you will be contacted by phone or by letter within the next 1-3 weeks.  Please call us at [phone] if you have not heard about the biopsies in 3 weeks.    SIGNATURES/CONFIDENTIALITY: You and/or your care partner have signed paperwork which will be entered into your electronic medical record.  These signatures attest to the fact that that the information above on your After Visit Summary has been reviewed and is understood.  Full responsibility of the confidentiality of this discharge information lies with you and/or your care-partner.

## 2017-04-08 NOTE — Progress Notes (Signed)
To PACU, VSS. Report to [email or web address] 

## 2017-04-09 ENCOUNTER — Telehealth: Payer: Self-pay

## 2017-04-09 NOTE — Telephone Encounter (Signed)
  Follow up Call-  Call back number 04/08/2017  Post procedure Call Back phone  # [phone]  Permission to leave phone message Yes  Some recent data might be hidden     Patient questions:  Do you have a fever, pain , or abdominal swelling? No. Pain Score  0 *  Have you tolerated food without any problems? Yes.    Have you been able to return to your normal activities? Yes.    Do you have any questions about your discharge instructions: Diet   No. Medications  No. Follow up visit  No.  Do you have questions or concerns about your Care? No.  Actions: * If pain score is 4 or above: No action needed, pain <4.

## 2017-04-15 ENCOUNTER — Encounter: Payer: Self-pay | Admitting: Internal Medicine

## 2017-04-15 DIAGNOSIS — N401 Enlarged prostate with lower urinary tract symptoms: Secondary | ICD-10-CM | POA: Diagnosis not present

## 2017-04-15 DIAGNOSIS — R972 Elevated prostate specific antigen [PSA]: Secondary | ICD-10-CM | POA: Diagnosis not present

## 2017-04-15 DIAGNOSIS — R351 Nocturia: Secondary | ICD-10-CM | POA: Diagnosis not present

## 2017-04-15 DIAGNOSIS — R3912 Poor urinary stream: Secondary | ICD-10-CM | POA: Diagnosis not present

## 2017-04-15 DIAGNOSIS — Z86010 Personal history of colonic polyps: Secondary | ICD-10-CM

## 2017-04-15 HISTORY — DX: Personal history of colonic polyps: Z86.010

## 2017-04-15 NOTE — Progress Notes (Signed)
2 diminutive adenomas No recall My Chart letter

## 2017-05-10 DIAGNOSIS — M25562 Pain in left knee: Secondary | ICD-10-CM | POA: Diagnosis not present

## 2017-07-15 IMAGING — CT CT ANGIO HEAD
1 of 8 series · 6 of 33 positions shown · IV contrast (OMNI 350)
Comparison: Head CT and brain MRI from yesterday

CLINICAL DATA: Acute onset of gait abnormality.

EXAM:
CT ANGIOGRAPHY HEAD AND NECK
TECHNIQUE: Multidetector CT imaging of the head and neck was performed using
the standard protocol during bolus administration of intravenous
contrast. Multiplanar CT image reconstructions and MIPs were
obtained to evaluate the vascular anatomy. Carotid stenosis
measurements (when applicable) are obtained utilizing NASCET
criteria, using the distal internal carotid diameter as the
denominator.
CONTRAST:  50 cc Isovue 370 intravenous

[Series 8: cta neck axial · axial · 0.42mm/px · z∈[-253,-5]mm · 6 of 348 slices shown]
[im 50/348  soft-tissue]
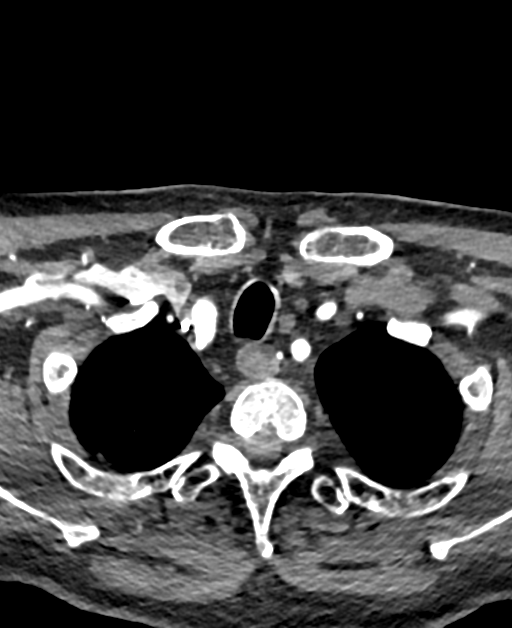
[im 100/348  bone]
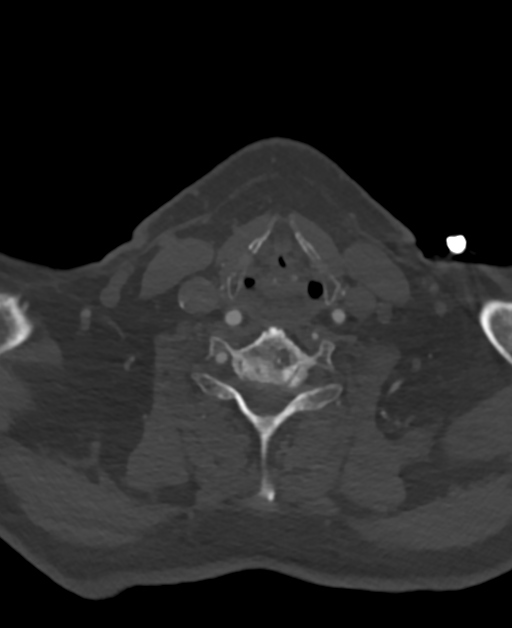
[im 149/348  soft-tissue]
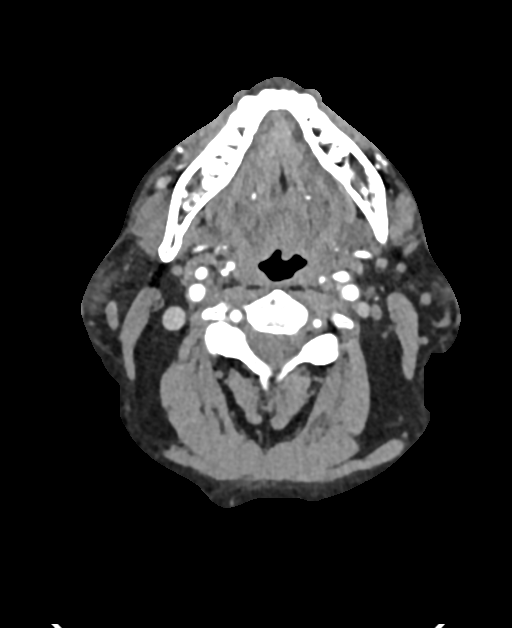
[im 199/348  bone]
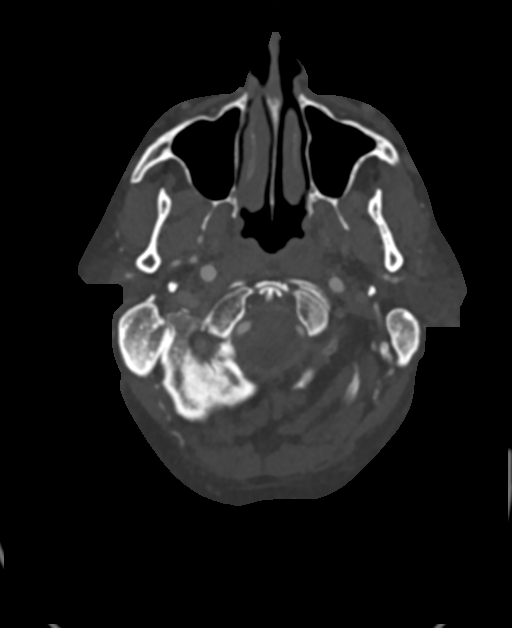
[im 248/348  soft-tissue]
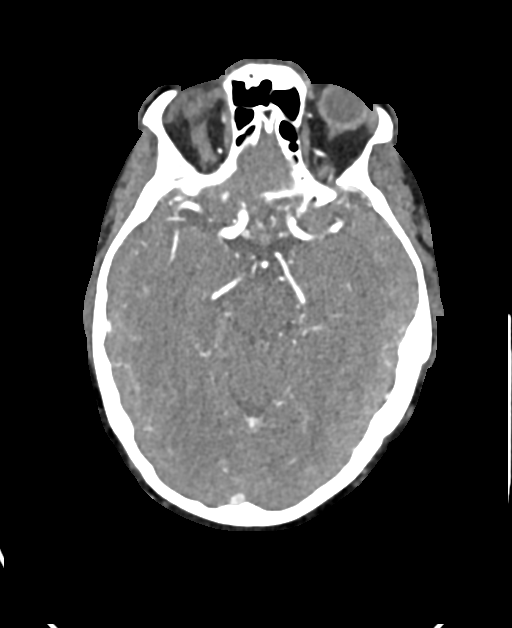
[im 298/348  bone]
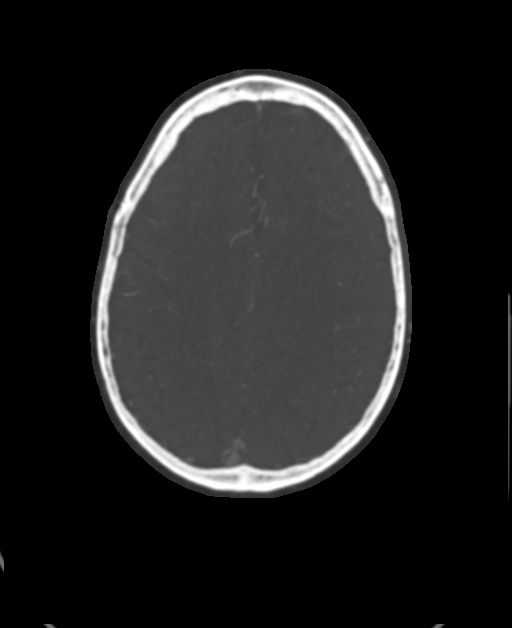

[6 of 33 positions shown; findings below may reference images not displayed]

FINDINGS: CTA NECK FINDINGS

Aortic arch: Atheromatous wall thickening and calcification that is
mild. Four vessel branching. Motion artifact affects visualization
of the proximal left subclavian artery and accounts for the hazy
indistinct margins. Along the anterior wall there is likely
atherosclerotic plaque that is accentuated by artifact. No suspected
dissection.

Right carotid system: Calcified and noncalcified plaque at the
common carotid bifurcation and bulb without stenosis, ulceration, or
dissection.

Left carotid system: Predominately noncalcified plaque at the common
carotid bifurcation and ICA bulb without flow limiting stenosis,
ulceration, or dissection.

Vertebral arteries: Atherosclerosis but no stenosis of the
brachiocephalic and right subclavian arteries. The non dominant left
vertebral artery arises from the arch. Proximal left vertebral
evaluation is limited by a motion artifact, which causes duplicated
appearance of this vessel. No suspected vertebral stenosis or
dissection.

Skeleton: Degenerative changes.  No acute or aggressive finding.

Other neck: No incidental mass or adenopathy.

Upper chest: Negative

Review of the MIP images confirms the above findings

CTA HEAD FINDINGS

Anterior circulation: The right ICA is larger than the left in the
setting of large right posterior communicating artery and aplastic
left A1 segment. Mild atherosclerotic calcification on the carotid
siphons. No major branch occlusion or flow limiting stenosis.

Posterior circulation: Right dominant. The left vertebral artery is
miniscule after high the PICA. Aplastic right P1 segment. Shared
origin of the left superior cerebellar artery and PCA.

Venous sinuses: Patent

Anatomic variants: Described above

Delayed phase: No abnormal intracranial enhancement.

Review of the MIP images confirms the above findings
IMPRESSION: 1. No acute finding including large vessel occlusion.
2. Carotid atherosclerosis.  No flow limiting stenosis.

## 2017-08-05 DIAGNOSIS — H52223 Regular astigmatism, bilateral: Secondary | ICD-10-CM | POA: Diagnosis not present

## 2017-08-05 DIAGNOSIS — H5213 Myopia, bilateral: Secondary | ICD-10-CM | POA: Diagnosis not present

## 2017-08-05 DIAGNOSIS — H524 Presbyopia: Secondary | ICD-10-CM | POA: Diagnosis not present

## 2017-08-05 DIAGNOSIS — H2513 Age-related nuclear cataract, bilateral: Secondary | ICD-10-CM | POA: Diagnosis not present

## 2017-08-26 DIAGNOSIS — R972 Elevated prostate specific antigen [PSA]: Secondary | ICD-10-CM | POA: Diagnosis not present

## 2017-09-01 DIAGNOSIS — R972 Elevated prostate specific antigen [PSA]: Secondary | ICD-10-CM | POA: Diagnosis not present

## 2017-09-01 DIAGNOSIS — N401 Enlarged prostate with lower urinary tract symptoms: Secondary | ICD-10-CM | POA: Diagnosis not present

## 2017-09-01 DIAGNOSIS — R3912 Poor urinary stream: Secondary | ICD-10-CM | POA: Diagnosis not present

## 2017-10-08 DIAGNOSIS — L821 Other seborrheic keratosis: Secondary | ICD-10-CM | POA: Diagnosis not present

## 2017-10-08 DIAGNOSIS — Z85828 Personal history of other malignant neoplasm of skin: Secondary | ICD-10-CM | POA: Diagnosis not present

## 2017-10-08 DIAGNOSIS — L570 Actinic keratosis: Secondary | ICD-10-CM | POA: Diagnosis not present

## 2017-10-13 DIAGNOSIS — Z23 Encounter for immunization: Secondary | ICD-10-CM | POA: Diagnosis not present

## 2018-02-10 DIAGNOSIS — H524 Presbyopia: Secondary | ICD-10-CM | POA: Diagnosis not present

## 2018-02-10 DIAGNOSIS — H25042 Posterior subcapsular polar age-related cataract, left eye: Secondary | ICD-10-CM | POA: Diagnosis not present

## 2018-02-10 DIAGNOSIS — H2513 Age-related nuclear cataract, bilateral: Secondary | ICD-10-CM | POA: Diagnosis not present

## 2018-02-24 DIAGNOSIS — L812 Freckles: Secondary | ICD-10-CM | POA: Diagnosis not present

## 2018-02-24 DIAGNOSIS — L570 Actinic keratosis: Secondary | ICD-10-CM | POA: Diagnosis not present

## 2018-02-24 DIAGNOSIS — Z85828 Personal history of other malignant neoplasm of skin: Secondary | ICD-10-CM | POA: Diagnosis not present

## 2018-02-24 DIAGNOSIS — L814 Other melanin hyperpigmentation: Secondary | ICD-10-CM | POA: Diagnosis not present

## 2018-02-24 DIAGNOSIS — L821 Other seborrheic keratosis: Secondary | ICD-10-CM | POA: Diagnosis not present

## 2018-05-09 DIAGNOSIS — R7309 Other abnormal glucose: Secondary | ICD-10-CM | POA: Diagnosis not present

## 2018-05-09 DIAGNOSIS — E7800 Pure hypercholesterolemia, unspecified: Secondary | ICD-10-CM | POA: Diagnosis not present

## 2018-05-09 DIAGNOSIS — R972 Elevated prostate specific antigen [PSA]: Secondary | ICD-10-CM | POA: Diagnosis not present

## 2018-05-09 DIAGNOSIS — G459 Transient cerebral ischemic attack, unspecified: Secondary | ICD-10-CM | POA: Diagnosis not present

## 2018-05-09 DIAGNOSIS — Z1389 Encounter for screening for other disorder: Secondary | ICD-10-CM | POA: Diagnosis not present

## 2018-05-09 DIAGNOSIS — Z0000 Encounter for general adult medical examination without abnormal findings: Secondary | ICD-10-CM | POA: Diagnosis not present

## 2018-09-13 DIAGNOSIS — Z23 Encounter for immunization: Secondary | ICD-10-CM | POA: Diagnosis not present

## 2018-10-20 DIAGNOSIS — G459 Transient cerebral ischemic attack, unspecified: Secondary | ICD-10-CM | POA: Diagnosis not present

## 2018-10-20 DIAGNOSIS — R972 Elevated prostate specific antigen [PSA]: Secondary | ICD-10-CM | POA: Diagnosis not present

## 2018-10-20 DIAGNOSIS — R7309 Other abnormal glucose: Secondary | ICD-10-CM | POA: Diagnosis not present

## 2018-10-20 DIAGNOSIS — E7800 Pure hypercholesterolemia, unspecified: Secondary | ICD-10-CM | POA: Diagnosis not present

## 2018-11-21 DIAGNOSIS — M25562 Pain in left knee: Secondary | ICD-10-CM | POA: Diagnosis not present

## 2018-11-23 DIAGNOSIS — M25562 Pain in left knee: Secondary | ICD-10-CM | POA: Diagnosis not present

## 2018-11-25 DIAGNOSIS — M25562 Pain in left knee: Secondary | ICD-10-CM | POA: Diagnosis not present

## 2018-11-28 DIAGNOSIS — M25562 Pain in left knee: Secondary | ICD-10-CM | POA: Diagnosis not present

## 2018-11-29 DIAGNOSIS — M25562 Pain in left knee: Secondary | ICD-10-CM | POA: Diagnosis not present

## 2018-11-29 DIAGNOSIS — S83242A Other tear of medial meniscus, current injury, left knee, initial encounter: Secondary | ICD-10-CM | POA: Diagnosis not present

## 2018-11-29 DIAGNOSIS — M6281 Muscle weakness (generalized): Secondary | ICD-10-CM | POA: Diagnosis not present

## 2018-12-05 DIAGNOSIS — M6281 Muscle weakness (generalized): Secondary | ICD-10-CM | POA: Diagnosis not present

## 2018-12-05 DIAGNOSIS — M25562 Pain in left knee: Secondary | ICD-10-CM | POA: Diagnosis not present

## 2018-12-05 DIAGNOSIS — S83242A Other tear of medial meniscus, current injury, left knee, initial encounter: Secondary | ICD-10-CM | POA: Diagnosis not present

## 2018-12-12 DIAGNOSIS — M25562 Pain in left knee: Secondary | ICD-10-CM | POA: Diagnosis not present

## 2018-12-12 DIAGNOSIS — S83242A Other tear of medial meniscus, current injury, left knee, initial encounter: Secondary | ICD-10-CM | POA: Diagnosis not present

## 2018-12-12 DIAGNOSIS — M6281 Muscle weakness (generalized): Secondary | ICD-10-CM | POA: Diagnosis not present

## 2019-01-18 ENCOUNTER — Ambulatory Visit: Payer: Medicare Other | Attending: Internal Medicine

## 2019-01-18 DIAGNOSIS — M25562 Pain in left knee: Secondary | ICD-10-CM | POA: Diagnosis not present

## 2019-01-18 DIAGNOSIS — Z23 Encounter for immunization: Secondary | ICD-10-CM | POA: Diagnosis not present

## 2019-01-18 DIAGNOSIS — M6281 Muscle weakness (generalized): Secondary | ICD-10-CM | POA: Diagnosis not present

## 2019-01-18 DIAGNOSIS — S83242A Other tear of medial meniscus, current injury, left knee, initial encounter: Secondary | ICD-10-CM | POA: Diagnosis not present

## 2019-01-18 NOTE — Progress Notes (Signed)
Covid-19 Vaccination Clinic  Name:  FIRAS REEM    MRN: [id number] DOB: April 14, 1945  01/18/2019  Mr. Ruelas was observed post Covid-19 immunization for 15 minutes without incidence. He was provided with Vaccine Information Sheet and instruction to access the V-Safe system.   Mr. Strite was instructed to call 911 with any severe reactions post vaccine: Marland Kitchen Difficulty breathing  . Swelling of your face and throat  . A fast heartbeat  . A bad rash all over your body  . Dizziness and weakness    Immunizations Administered    Name Date Dose VIS Date Route   Pfizer COVID-19 Vaccine 01/18/2019  9:20 AM 0.3 mL 12/16/2018 Intramuscular   Manufacturer: [employer], [employer]   Lot: [id number]   NDC: [address]-7

## 2019-02-07 ENCOUNTER — Ambulatory Visit: Payer: Medicare Other | Attending: Internal Medicine

## 2019-02-07 DIAGNOSIS — Z23 Encounter for immunization: Secondary | ICD-10-CM

## 2019-02-07 NOTE — Progress Notes (Signed)
Covid-19 Vaccination Clinic  Name:  David Allen    MRN: [id number] DOB: 05/18/1945  02/07/2019  [name] was observed post Covid-19 immunization for 15 minutes without incidence. He was provided with Vaccine Information Sheet and instruction to access the V-Safe system.   [name] was instructed to call 911 with any severe reactions post vaccine: Marland Kitchen Difficulty breathing  . Swelling of your face and throat  . A fast heartbeat  . A bad rash all over your body  . Dizziness and weakness    Immunizations Administered    Name Date Dose VIS Date Route   Pfizer COVID-19 Vaccine 02/07/2019  8:24 AM 0.3 mL 12/16/2018 Intramuscular   Manufacturer: [employer], [employer]   Lot: [id number]   NDC: [address]-1

## 2019-03-06 DIAGNOSIS — D0439 Carcinoma in situ of skin of other parts of face: Secondary | ICD-10-CM | POA: Diagnosis not present

## 2019-03-06 DIAGNOSIS — L821 Other seborrheic keratosis: Secondary | ICD-10-CM | POA: Diagnosis not present

## 2019-03-06 DIAGNOSIS — D044 Carcinoma in situ of skin of scalp and neck: Secondary | ICD-10-CM | POA: Diagnosis not present

## 2019-03-06 DIAGNOSIS — L820 Inflamed seborrheic keratosis: Secondary | ICD-10-CM | POA: Diagnosis not present

## 2019-03-06 DIAGNOSIS — L812 Freckles: Secondary | ICD-10-CM | POA: Diagnosis not present

## 2019-03-06 DIAGNOSIS — L570 Actinic keratosis: Secondary | ICD-10-CM | POA: Diagnosis not present

## 2019-03-06 DIAGNOSIS — Z85828 Personal history of other malignant neoplasm of skin: Secondary | ICD-10-CM | POA: Diagnosis not present

## 2019-03-06 DIAGNOSIS — D485 Neoplasm of uncertain behavior of skin: Secondary | ICD-10-CM | POA: Diagnosis not present

## 2019-03-08 DIAGNOSIS — H2513 Age-related nuclear cataract, bilateral: Secondary | ICD-10-CM | POA: Diagnosis not present

## 2019-03-08 DIAGNOSIS — H43813 Vitreous degeneration, bilateral: Secondary | ICD-10-CM | POA: Diagnosis not present

## 2019-03-08 DIAGNOSIS — H25042 Posterior subcapsular polar age-related cataract, left eye: Secondary | ICD-10-CM | POA: Diagnosis not present

## 2019-03-08 DIAGNOSIS — H524 Presbyopia: Secondary | ICD-10-CM | POA: Diagnosis not present

## 2019-03-08 DIAGNOSIS — H02831 Dermatochalasis of right upper eyelid: Secondary | ICD-10-CM | POA: Diagnosis not present

## 2019-04-19 DIAGNOSIS — C44329 Squamous cell carcinoma of skin of other parts of face: Secondary | ICD-10-CM | POA: Diagnosis not present

## 2019-04-19 DIAGNOSIS — Z85828 Personal history of other malignant neoplasm of skin: Secondary | ICD-10-CM | POA: Diagnosis not present

## 2019-05-15 DIAGNOSIS — R972 Elevated prostate specific antigen [PSA]: Secondary | ICD-10-CM | POA: Diagnosis not present

## 2019-05-15 DIAGNOSIS — Z1389 Encounter for screening for other disorder: Secondary | ICD-10-CM | POA: Diagnosis not present

## 2019-05-15 DIAGNOSIS — E7800 Pure hypercholesterolemia, unspecified: Secondary | ICD-10-CM | POA: Diagnosis not present

## 2019-05-15 DIAGNOSIS — G459 Transient cerebral ischemic attack, unspecified: Secondary | ICD-10-CM | POA: Diagnosis not present

## 2019-05-15 DIAGNOSIS — Z0000 Encounter for general adult medical examination without abnormal findings: Secondary | ICD-10-CM | POA: Diagnosis not present

## 2019-09-06 DIAGNOSIS — H25042 Posterior subcapsular polar age-related cataract, left eye: Secondary | ICD-10-CM | POA: Diagnosis not present

## 2019-09-06 DIAGNOSIS — H2513 Age-related nuclear cataract, bilateral: Secondary | ICD-10-CM | POA: Diagnosis not present

## 2019-09-06 DIAGNOSIS — H02831 Dermatochalasis of right upper eyelid: Secondary | ICD-10-CM | POA: Diagnosis not present

## 2019-09-06 DIAGNOSIS — H524 Presbyopia: Secondary | ICD-10-CM | POA: Diagnosis not present

## 2019-09-06 DIAGNOSIS — H43813 Vitreous degeneration, bilateral: Secondary | ICD-10-CM | POA: Diagnosis not present

## 2019-09-18 DIAGNOSIS — H2512 Age-related nuclear cataract, left eye: Secondary | ICD-10-CM | POA: Diagnosis not present

## 2019-09-18 DIAGNOSIS — H2513 Age-related nuclear cataract, bilateral: Secondary | ICD-10-CM | POA: Diagnosis not present

## 2019-09-23 DIAGNOSIS — Z23 Encounter for immunization: Secondary | ICD-10-CM | POA: Diagnosis not present

## 2019-10-06 DIAGNOSIS — Z23 Encounter for immunization: Secondary | ICD-10-CM | POA: Diagnosis not present

## 2019-10-10 DIAGNOSIS — H25812 Combined forms of age-related cataract, left eye: Secondary | ICD-10-CM | POA: Diagnosis not present

## 2019-10-10 DIAGNOSIS — H2512 Age-related nuclear cataract, left eye: Secondary | ICD-10-CM | POA: Diagnosis not present

## 2019-10-16 ENCOUNTER — Other Ambulatory Visit: Payer: Medicare Other

## 2019-10-16 DIAGNOSIS — Z20822 Contact with and (suspected) exposure to covid-19: Secondary | ICD-10-CM | POA: Diagnosis not present

## 2019-10-17 LAB — NOVEL CORONAVIRUS, NAA: SARS-CoV-2, NAA: NOT DETECTED

## 2019-10-17 LAB — SARS-COV-2, NAA 2 DAY TAT

## 2019-10-18 DIAGNOSIS — H2511 Age-related nuclear cataract, right eye: Secondary | ICD-10-CM | POA: Diagnosis not present

## 2019-10-31 DIAGNOSIS — H2511 Age-related nuclear cataract, right eye: Secondary | ICD-10-CM | POA: Diagnosis not present

## 2020-03-05 DIAGNOSIS — Z85828 Personal history of other malignant neoplasm of skin: Secondary | ICD-10-CM | POA: Diagnosis not present

## 2020-03-05 DIAGNOSIS — L570 Actinic keratosis: Secondary | ICD-10-CM | POA: Diagnosis not present

## 2020-03-05 DIAGNOSIS — L858 Other specified epidermal thickening: Secondary | ICD-10-CM | POA: Diagnosis not present

## 2020-03-05 DIAGNOSIS — L821 Other seborrheic keratosis: Secondary | ICD-10-CM | POA: Diagnosis not present

## 2020-03-05 DIAGNOSIS — L812 Freckles: Secondary | ICD-10-CM | POA: Diagnosis not present

## 2020-03-22 DIAGNOSIS — Z23 Encounter for immunization: Secondary | ICD-10-CM | POA: Diagnosis not present

## 2020-04-24 DIAGNOSIS — W19XXXA Unspecified fall, initial encounter: Secondary | ICD-10-CM | POA: Diagnosis not present

## 2020-04-24 DIAGNOSIS — S2232XA Fracture of one rib, left side, initial encounter for closed fracture: Secondary | ICD-10-CM | POA: Diagnosis not present

## 2020-04-24 DIAGNOSIS — S299XXA Unspecified injury of thorax, initial encounter: Secondary | ICD-10-CM | POA: Diagnosis not present

## 2020-04-24 DIAGNOSIS — S46912A Strain of unspecified muscle, fascia and tendon at shoulder and upper arm level, left arm, initial encounter: Secondary | ICD-10-CM | POA: Diagnosis not present

## 2020-05-08 DIAGNOSIS — M25512 Pain in left shoulder: Secondary | ICD-10-CM | POA: Diagnosis not present

## 2020-05-09 DIAGNOSIS — M25512 Pain in left shoulder: Secondary | ICD-10-CM | POA: Diagnosis not present

## 2020-05-13 DIAGNOSIS — M25512 Pain in left shoulder: Secondary | ICD-10-CM | POA: Diagnosis not present

## 2020-05-15 DIAGNOSIS — Z1389 Encounter for screening for other disorder: Secondary | ICD-10-CM | POA: Diagnosis not present

## 2020-05-15 DIAGNOSIS — Z23 Encounter for immunization: Secondary | ICD-10-CM | POA: Diagnosis not present

## 2020-05-15 DIAGNOSIS — R972 Elevated prostate specific antigen [PSA]: Secondary | ICD-10-CM | POA: Diagnosis not present

## 2020-05-15 DIAGNOSIS — G459 Transient cerebral ischemic attack, unspecified: Secondary | ICD-10-CM | POA: Diagnosis not present

## 2020-05-15 DIAGNOSIS — Z0000 Encounter for general adult medical examination without abnormal findings: Secondary | ICD-10-CM | POA: Diagnosis not present

## 2020-05-15 DIAGNOSIS — M75102 Unspecified rotator cuff tear or rupture of left shoulder, not specified as traumatic: Secondary | ICD-10-CM | POA: Diagnosis not present

## 2020-05-15 DIAGNOSIS — M12812 Other specific arthropathies, not elsewhere classified, left shoulder: Secondary | ICD-10-CM | POA: Diagnosis not present

## 2020-05-15 DIAGNOSIS — E7800 Pure hypercholesterolemia, unspecified: Secondary | ICD-10-CM | POA: Diagnosis not present

## 2020-05-23 DIAGNOSIS — M7542 Impingement syndrome of left shoulder: Secondary | ICD-10-CM | POA: Diagnosis not present

## 2020-05-23 DIAGNOSIS — X58XXXA Exposure to other specified factors, initial encounter: Secondary | ICD-10-CM | POA: Diagnosis not present

## 2020-05-23 DIAGNOSIS — S46012A Strain of muscle(s) and tendon(s) of the rotator cuff of left shoulder, initial encounter: Secondary | ICD-10-CM | POA: Diagnosis not present

## 2020-05-23 DIAGNOSIS — M75122 Complete rotator cuff tear or rupture of left shoulder, not specified as traumatic: Secondary | ICD-10-CM | POA: Diagnosis not present

## 2020-05-23 DIAGNOSIS — M24112 Other articular cartilage disorders, left shoulder: Secondary | ICD-10-CM | POA: Diagnosis not present

## 2020-05-23 DIAGNOSIS — M67814 Other specified disorders of tendon, left shoulder: Secondary | ICD-10-CM | POA: Diagnosis not present

## 2020-05-23 DIAGNOSIS — M7522 Bicipital tendinitis, left shoulder: Secondary | ICD-10-CM | POA: Diagnosis not present

## 2020-05-23 DIAGNOSIS — G8918 Other acute postprocedural pain: Secondary | ICD-10-CM | POA: Diagnosis not present

## 2020-05-23 DIAGNOSIS — M19012 Primary osteoarthritis, left shoulder: Secondary | ICD-10-CM | POA: Diagnosis not present

## 2020-05-23 DIAGNOSIS — Y999 Unspecified external cause status: Secondary | ICD-10-CM | POA: Diagnosis not present

## 2020-06-05 DIAGNOSIS — M19012 Primary osteoarthritis, left shoulder: Secondary | ICD-10-CM | POA: Diagnosis not present

## 2020-06-07 DIAGNOSIS — M6281 Muscle weakness (generalized): Secondary | ICD-10-CM | POA: Diagnosis not present

## 2020-06-07 DIAGNOSIS — M25612 Stiffness of left shoulder, not elsewhere classified: Secondary | ICD-10-CM | POA: Diagnosis not present

## 2020-06-07 DIAGNOSIS — M75122 Complete rotator cuff tear or rupture of left shoulder, not specified as traumatic: Secondary | ICD-10-CM | POA: Diagnosis not present

## 2020-06-10 DIAGNOSIS — M2560 Stiffness of unspecified joint, not elsewhere classified: Secondary | ICD-10-CM | POA: Diagnosis not present

## 2020-06-10 DIAGNOSIS — M6281 Muscle weakness (generalized): Secondary | ICD-10-CM | POA: Diagnosis not present

## 2020-06-13 DIAGNOSIS — M6281 Muscle weakness (generalized): Secondary | ICD-10-CM | POA: Diagnosis not present

## 2020-06-13 DIAGNOSIS — M2560 Stiffness of unspecified joint, not elsewhere classified: Secondary | ICD-10-CM | POA: Diagnosis not present

## 2020-06-17 DIAGNOSIS — M2560 Stiffness of unspecified joint, not elsewhere classified: Secondary | ICD-10-CM | POA: Diagnosis not present

## 2020-06-17 DIAGNOSIS — M6281 Muscle weakness (generalized): Secondary | ICD-10-CM | POA: Diagnosis not present

## 2020-06-19 DIAGNOSIS — M2560 Stiffness of unspecified joint, not elsewhere classified: Secondary | ICD-10-CM | POA: Diagnosis not present

## 2020-06-19 DIAGNOSIS — M6281 Muscle weakness (generalized): Secondary | ICD-10-CM | POA: Diagnosis not present

## 2020-06-21 DIAGNOSIS — M2560 Stiffness of unspecified joint, not elsewhere classified: Secondary | ICD-10-CM | POA: Diagnosis not present

## 2020-06-21 DIAGNOSIS — M6281 Muscle weakness (generalized): Secondary | ICD-10-CM | POA: Diagnosis not present

## 2020-06-26 DIAGNOSIS — M2560 Stiffness of unspecified joint, not elsewhere classified: Secondary | ICD-10-CM | POA: Diagnosis not present

## 2020-06-26 DIAGNOSIS — M6281 Muscle weakness (generalized): Secondary | ICD-10-CM | POA: Diagnosis not present

## 2020-07-02 DIAGNOSIS — M6281 Muscle weakness (generalized): Secondary | ICD-10-CM | POA: Diagnosis not present

## 2020-07-02 DIAGNOSIS — M75122 Complete rotator cuff tear or rupture of left shoulder, not specified as traumatic: Secondary | ICD-10-CM | POA: Diagnosis not present

## 2020-07-02 DIAGNOSIS — M25612 Stiffness of left shoulder, not elsewhere classified: Secondary | ICD-10-CM | POA: Diagnosis not present

## 2020-07-05 DIAGNOSIS — M75122 Complete rotator cuff tear or rupture of left shoulder, not specified as traumatic: Secondary | ICD-10-CM | POA: Diagnosis not present

## 2020-07-09 DIAGNOSIS — M6281 Muscle weakness (generalized): Secondary | ICD-10-CM | POA: Diagnosis not present

## 2020-07-09 DIAGNOSIS — M25612 Stiffness of left shoulder, not elsewhere classified: Secondary | ICD-10-CM | POA: Diagnosis not present

## 2020-07-09 DIAGNOSIS — M75122 Complete rotator cuff tear or rupture of left shoulder, not specified as traumatic: Secondary | ICD-10-CM | POA: Diagnosis not present

## 2020-07-11 DIAGNOSIS — M6281 Muscle weakness (generalized): Secondary | ICD-10-CM | POA: Diagnosis not present

## 2020-07-11 DIAGNOSIS — M2560 Stiffness of unspecified joint, not elsewhere classified: Secondary | ICD-10-CM | POA: Diagnosis not present

## 2020-07-16 DIAGNOSIS — M6281 Muscle weakness (generalized): Secondary | ICD-10-CM | POA: Diagnosis not present

## 2020-07-16 DIAGNOSIS — M2560 Stiffness of unspecified joint, not elsewhere classified: Secondary | ICD-10-CM | POA: Diagnosis not present

## 2020-07-19 DIAGNOSIS — M6281 Muscle weakness (generalized): Secondary | ICD-10-CM | POA: Diagnosis not present

## 2020-07-19 DIAGNOSIS — M2560 Stiffness of unspecified joint, not elsewhere classified: Secondary | ICD-10-CM | POA: Diagnosis not present

## 2020-07-23 DIAGNOSIS — M6281 Muscle weakness (generalized): Secondary | ICD-10-CM | POA: Diagnosis not present

## 2020-07-23 DIAGNOSIS — M2560 Stiffness of unspecified joint, not elsewhere classified: Secondary | ICD-10-CM | POA: Diagnosis not present

## 2020-07-30 DIAGNOSIS — M25612 Stiffness of left shoulder, not elsewhere classified: Secondary | ICD-10-CM | POA: Diagnosis not present

## 2020-07-30 DIAGNOSIS — M6281 Muscle weakness (generalized): Secondary | ICD-10-CM | POA: Diagnosis not present

## 2020-07-30 DIAGNOSIS — M75122 Complete rotator cuff tear or rupture of left shoulder, not specified as traumatic: Secondary | ICD-10-CM | POA: Diagnosis not present

## 2020-07-31 DIAGNOSIS — H02834 Dermatochalasis of left upper eyelid: Secondary | ICD-10-CM | POA: Diagnosis not present

## 2020-07-31 DIAGNOSIS — Z961 Presence of intraocular lens: Secondary | ICD-10-CM | POA: Diagnosis not present

## 2020-07-31 DIAGNOSIS — H02831 Dermatochalasis of right upper eyelid: Secondary | ICD-10-CM | POA: Diagnosis not present

## 2020-07-31 DIAGNOSIS — H43813 Vitreous degeneration, bilateral: Secondary | ICD-10-CM | POA: Diagnosis not present

## 2020-08-01 DIAGNOSIS — M6281 Muscle weakness (generalized): Secondary | ICD-10-CM | POA: Diagnosis not present

## 2020-08-01 DIAGNOSIS — M75122 Complete rotator cuff tear or rupture of left shoulder, not specified as traumatic: Secondary | ICD-10-CM | POA: Diagnosis not present

## 2020-08-01 DIAGNOSIS — M25612 Stiffness of left shoulder, not elsewhere classified: Secondary | ICD-10-CM | POA: Diagnosis not present

## 2020-08-05 DIAGNOSIS — M25612 Stiffness of left shoulder, not elsewhere classified: Secondary | ICD-10-CM | POA: Diagnosis not present

## 2020-08-05 DIAGNOSIS — M6281 Muscle weakness (generalized): Secondary | ICD-10-CM | POA: Diagnosis not present

## 2020-08-05 DIAGNOSIS — M75122 Complete rotator cuff tear or rupture of left shoulder, not specified as traumatic: Secondary | ICD-10-CM | POA: Diagnosis not present

## 2020-08-08 DIAGNOSIS — M75122 Complete rotator cuff tear or rupture of left shoulder, not specified as traumatic: Secondary | ICD-10-CM | POA: Diagnosis not present

## 2020-08-08 DIAGNOSIS — M25612 Stiffness of left shoulder, not elsewhere classified: Secondary | ICD-10-CM | POA: Diagnosis not present

## 2020-08-08 DIAGNOSIS — M6281 Muscle weakness (generalized): Secondary | ICD-10-CM | POA: Diagnosis not present

## 2020-08-13 DIAGNOSIS — M6281 Muscle weakness (generalized): Secondary | ICD-10-CM | POA: Diagnosis not present

## 2020-08-13 DIAGNOSIS — M2560 Stiffness of unspecified joint, not elsewhere classified: Secondary | ICD-10-CM | POA: Diagnosis not present

## 2020-08-15 DIAGNOSIS — M6281 Muscle weakness (generalized): Secondary | ICD-10-CM | POA: Diagnosis not present

## 2020-08-15 DIAGNOSIS — M2560 Stiffness of unspecified joint, not elsewhere classified: Secondary | ICD-10-CM | POA: Diagnosis not present

## 2020-08-21 DIAGNOSIS — M75122 Complete rotator cuff tear or rupture of left shoulder, not specified as traumatic: Secondary | ICD-10-CM | POA: Diagnosis not present

## 2020-08-22 DIAGNOSIS — M6281 Muscle weakness (generalized): Secondary | ICD-10-CM | POA: Diagnosis not present

## 2020-08-22 DIAGNOSIS — M75122 Complete rotator cuff tear or rupture of left shoulder, not specified as traumatic: Secondary | ICD-10-CM | POA: Diagnosis not present

## 2020-08-22 DIAGNOSIS — M25612 Stiffness of left shoulder, not elsewhere classified: Secondary | ICD-10-CM | POA: Diagnosis not present

## 2020-09-25 DIAGNOSIS — Z23 Encounter for immunization: Secondary | ICD-10-CM | POA: Diagnosis not present

## 2020-10-02 DIAGNOSIS — Z23 Encounter for immunization: Secondary | ICD-10-CM | POA: Diagnosis not present

## 2021-02-21 DIAGNOSIS — Z20822 Contact with and (suspected) exposure to covid-19: Secondary | ICD-10-CM | POA: Diagnosis not present

## 2021-03-05 DIAGNOSIS — L821 Other seborrheic keratosis: Secondary | ICD-10-CM | POA: Diagnosis not present

## 2021-03-05 DIAGNOSIS — L570 Actinic keratosis: Secondary | ICD-10-CM | POA: Diagnosis not present

## 2021-03-05 DIAGNOSIS — Z85828 Personal history of other malignant neoplasm of skin: Secondary | ICD-10-CM | POA: Diagnosis not present

## 2021-03-05 DIAGNOSIS — D225 Melanocytic nevi of trunk: Secondary | ICD-10-CM | POA: Diagnosis not present

## 2021-03-05 DIAGNOSIS — L812 Freckles: Secondary | ICD-10-CM | POA: Diagnosis not present

## 2021-04-28 DIAGNOSIS — Z20822 Contact with and (suspected) exposure to covid-19: Secondary | ICD-10-CM | POA: Diagnosis not present

## 2021-05-24 DIAGNOSIS — Z23 Encounter for immunization: Secondary | ICD-10-CM | POA: Diagnosis not present

## 2021-06-12 DIAGNOSIS — G459 Transient cerebral ischemic attack, unspecified: Secondary | ICD-10-CM | POA: Diagnosis not present

## 2021-06-12 DIAGNOSIS — E7800 Pure hypercholesterolemia, unspecified: Secondary | ICD-10-CM | POA: Diagnosis not present

## 2021-06-12 DIAGNOSIS — R718 Other abnormality of red blood cells: Secondary | ICD-10-CM | POA: Diagnosis not present

## 2021-06-12 DIAGNOSIS — L989 Disorder of the skin and subcutaneous tissue, unspecified: Secondary | ICD-10-CM | POA: Diagnosis not present

## 2021-06-12 DIAGNOSIS — Z1389 Encounter for screening for other disorder: Secondary | ICD-10-CM | POA: Diagnosis not present

## 2021-06-12 DIAGNOSIS — G250 Essential tremor: Secondary | ICD-10-CM | POA: Diagnosis not present

## 2021-06-12 DIAGNOSIS — Z0000 Encounter for general adult medical examination without abnormal findings: Secondary | ICD-10-CM | POA: Diagnosis not present

## 2021-06-12 DIAGNOSIS — R972 Elevated prostate specific antigen [PSA]: Secondary | ICD-10-CM | POA: Diagnosis not present

## 2021-06-16 DIAGNOSIS — D518 Other vitamin B12 deficiency anemias: Secondary | ICD-10-CM | POA: Diagnosis not present

## 2021-06-17 DIAGNOSIS — D518 Other vitamin B12 deficiency anemias: Secondary | ICD-10-CM | POA: Diagnosis not present

## 2021-06-18 DIAGNOSIS — D518 Other vitamin B12 deficiency anemias: Secondary | ICD-10-CM | POA: Diagnosis not present

## 2021-06-19 DIAGNOSIS — D518 Other vitamin B12 deficiency anemias: Secondary | ICD-10-CM | POA: Diagnosis not present

## 2021-06-20 DIAGNOSIS — D518 Other vitamin B12 deficiency anemias: Secondary | ICD-10-CM | POA: Diagnosis not present

## 2021-07-09 DIAGNOSIS — C4442 Squamous cell carcinoma of skin of scalp and neck: Secondary | ICD-10-CM | POA: Diagnosis not present

## 2021-07-09 DIAGNOSIS — D485 Neoplasm of uncertain behavior of skin: Secondary | ICD-10-CM | POA: Diagnosis not present

## 2021-07-09 DIAGNOSIS — Z85828 Personal history of other malignant neoplasm of skin: Secondary | ICD-10-CM | POA: Diagnosis not present

## 2021-07-27 DIAGNOSIS — D518 Other vitamin B12 deficiency anemias: Secondary | ICD-10-CM | POA: Diagnosis not present

## 2021-08-13 DIAGNOSIS — H43813 Vitreous degeneration, bilateral: Secondary | ICD-10-CM | POA: Diagnosis not present

## 2021-08-13 DIAGNOSIS — H26491 Other secondary cataract, right eye: Secondary | ICD-10-CM | POA: Diagnosis not present

## 2021-08-13 DIAGNOSIS — Z961 Presence of intraocular lens: Secondary | ICD-10-CM | POA: Diagnosis not present

## 2021-08-13 DIAGNOSIS — H02834 Dermatochalasis of left upper eyelid: Secondary | ICD-10-CM | POA: Diagnosis not present

## 2021-08-18 DIAGNOSIS — C4442 Squamous cell carcinoma of skin of scalp and neck: Secondary | ICD-10-CM | POA: Diagnosis not present

## 2021-08-18 DIAGNOSIS — Z85828 Personal history of other malignant neoplasm of skin: Secondary | ICD-10-CM | POA: Diagnosis not present

## 2021-08-21 DIAGNOSIS — D518 Other vitamin B12 deficiency anemias: Secondary | ICD-10-CM | POA: Diagnosis not present

## 2021-09-17 DIAGNOSIS — Z23 Encounter for immunization: Secondary | ICD-10-CM | POA: Diagnosis not present

## 2021-09-29 DIAGNOSIS — Z23 Encounter for immunization: Secondary | ICD-10-CM | POA: Diagnosis not present

## 2021-10-23 DIAGNOSIS — D518 Other vitamin B12 deficiency anemias: Secondary | ICD-10-CM | POA: Diagnosis not present

## 2021-11-25 DIAGNOSIS — D518 Other vitamin B12 deficiency anemias: Secondary | ICD-10-CM | POA: Diagnosis not present

## 2022-01-17 DIAGNOSIS — D518 Other vitamin B12 deficiency anemias: Secondary | ICD-10-CM | POA: Diagnosis not present

## 2022-03-02 DIAGNOSIS — L720 Epidermal cyst: Secondary | ICD-10-CM | POA: Diagnosis not present

## 2022-03-02 DIAGNOSIS — D2261 Melanocytic nevi of right upper limb, including shoulder: Secondary | ICD-10-CM | POA: Diagnosis not present

## 2022-03-02 DIAGNOSIS — Z85828 Personal history of other malignant neoplasm of skin: Secondary | ICD-10-CM | POA: Diagnosis not present

## 2022-03-02 DIAGNOSIS — L812 Freckles: Secondary | ICD-10-CM | POA: Diagnosis not present

## 2022-03-02 DIAGNOSIS — L821 Other seborrheic keratosis: Secondary | ICD-10-CM | POA: Diagnosis not present

## 2022-03-02 DIAGNOSIS — L570 Actinic keratosis: Secondary | ICD-10-CM | POA: Diagnosis not present

## 2022-03-03 DIAGNOSIS — D518 Other vitamin B12 deficiency anemias: Secondary | ICD-10-CM | POA: Diagnosis not present

## 2022-03-16 DIAGNOSIS — Z23 Encounter for immunization: Secondary | ICD-10-CM | POA: Diagnosis not present

## 2022-03-30 DIAGNOSIS — D518 Other vitamin B12 deficiency anemias: Secondary | ICD-10-CM | POA: Diagnosis not present

## 2022-05-06 DIAGNOSIS — D518 Other vitamin B12 deficiency anemias: Secondary | ICD-10-CM | POA: Diagnosis not present

## 2022-06-09 DIAGNOSIS — D518 Other vitamin B12 deficiency anemias: Secondary | ICD-10-CM | POA: Diagnosis not present

## 2022-07-29 DIAGNOSIS — G459 Transient cerebral ischemic attack, unspecified: Secondary | ICD-10-CM | POA: Diagnosis not present

## 2022-07-29 DIAGNOSIS — Z0000 Encounter for general adult medical examination without abnormal findings: Secondary | ICD-10-CM | POA: Diagnosis not present

## 2022-07-29 DIAGNOSIS — E7800 Pure hypercholesterolemia, unspecified: Secondary | ICD-10-CM | POA: Diagnosis not present

## 2022-07-29 DIAGNOSIS — G250 Essential tremor: Secondary | ICD-10-CM | POA: Diagnosis not present

## 2022-07-29 DIAGNOSIS — Z1389 Encounter for screening for other disorder: Secondary | ICD-10-CM | POA: Diagnosis not present

## 2022-07-29 DIAGNOSIS — E538 Deficiency of other specified B group vitamins: Secondary | ICD-10-CM | POA: Diagnosis not present

## 2022-07-29 DIAGNOSIS — Z85828 Personal history of other malignant neoplasm of skin: Secondary | ICD-10-CM | POA: Diagnosis not present

## 2022-07-29 DIAGNOSIS — R972 Elevated prostate specific antigen [PSA]: Secondary | ICD-10-CM | POA: Diagnosis not present

## 2022-09-10 ENCOUNTER — Other Ambulatory Visit: Payer: Self-pay | Admitting: Urology

## 2022-09-10 DIAGNOSIS — N401 Enlarged prostate with lower urinary tract symptoms: Secondary | ICD-10-CM | POA: Diagnosis not present

## 2022-09-10 DIAGNOSIS — R972 Elevated prostate specific antigen [PSA]: Secondary | ICD-10-CM | POA: Diagnosis not present

## 2022-09-10 DIAGNOSIS — R3912 Poor urinary stream: Secondary | ICD-10-CM | POA: Diagnosis not present

## 2022-09-10 DIAGNOSIS — N403 Nodular prostate with lower urinary tract symptoms: Secondary | ICD-10-CM | POA: Diagnosis not present

## 2022-09-15 DIAGNOSIS — C4359 Malignant melanoma of other part of trunk: Secondary | ICD-10-CM | POA: Diagnosis not present

## 2022-09-15 DIAGNOSIS — L570 Actinic keratosis: Secondary | ICD-10-CM | POA: Diagnosis not present

## 2022-09-15 DIAGNOSIS — L738 Other specified follicular disorders: Secondary | ICD-10-CM | POA: Diagnosis not present

## 2022-09-15 DIAGNOSIS — Z85828 Personal history of other malignant neoplasm of skin: Secondary | ICD-10-CM | POA: Diagnosis not present

## 2022-09-15 DIAGNOSIS — L812 Freckles: Secondary | ICD-10-CM | POA: Diagnosis not present

## 2022-09-15 DIAGNOSIS — L821 Other seborrheic keratosis: Secondary | ICD-10-CM | POA: Diagnosis not present

## 2022-09-15 DIAGNOSIS — D485 Neoplasm of uncertain behavior of skin: Secondary | ICD-10-CM | POA: Diagnosis not present

## 2022-09-15 DIAGNOSIS — L720 Epidermal cyst: Secondary | ICD-10-CM | POA: Diagnosis not present

## 2022-09-29 DIAGNOSIS — Z23 Encounter for immunization: Secondary | ICD-10-CM | POA: Diagnosis not present

## 2022-10-09 DIAGNOSIS — Z961 Presence of intraocular lens: Secondary | ICD-10-CM | POA: Diagnosis not present

## 2022-10-09 DIAGNOSIS — H43813 Vitreous degeneration, bilateral: Secondary | ICD-10-CM | POA: Diagnosis not present

## 2022-10-09 DIAGNOSIS — H26491 Other secondary cataract, right eye: Secondary | ICD-10-CM | POA: Diagnosis not present

## 2022-10-09 DIAGNOSIS — H02831 Dermatochalasis of right upper eyelid: Secondary | ICD-10-CM | POA: Diagnosis not present

## 2022-10-09 DIAGNOSIS — H02834 Dermatochalasis of left upper eyelid: Secondary | ICD-10-CM | POA: Diagnosis not present

## 2022-10-22 DIAGNOSIS — L988 Other specified disorders of the skin and subcutaneous tissue: Secondary | ICD-10-CM | POA: Diagnosis not present

## 2022-10-22 DIAGNOSIS — C4359 Malignant melanoma of other part of trunk: Secondary | ICD-10-CM | POA: Diagnosis not present

## 2022-10-22 DIAGNOSIS — Z85820 Personal history of malignant melanoma of skin: Secondary | ICD-10-CM | POA: Diagnosis not present

## 2022-10-22 DIAGNOSIS — Z85828 Personal history of other malignant neoplasm of skin: Secondary | ICD-10-CM | POA: Diagnosis not present

## 2022-10-23 ENCOUNTER — Ambulatory Visit
Admission: RE | Admit: 2022-10-23 | Discharge: 2022-10-23 | Disposition: A | Discharge status: Home / Self Care | Payer: Medicare Other | Source: Ambulatory Visit | Attending: Urology | Admitting: Urology

## 2022-10-23 DIAGNOSIS — R972 Elevated prostate specific antigen [PSA]: Secondary | ICD-10-CM | POA: Diagnosis not present

## 2022-10-23 MED ORDER — GADOPICLENOL 0.5 MMOL/ML IV SOLN
9.0000 mL | Freq: Once | INTRAVENOUS | Status: AC | PRN
  Administered 2022-10-23: 9 mL via INTRAVENOUS

## 2022-10-24 DIAGNOSIS — Z23 Encounter for immunization: Secondary | ICD-10-CM | POA: Diagnosis not present

## 2022-10-29 DIAGNOSIS — N403 Nodular prostate with lower urinary tract symptoms: Secondary | ICD-10-CM | POA: Diagnosis not present

## 2022-10-29 DIAGNOSIS — D400 Neoplasm of uncertain behavior of prostate: Secondary | ICD-10-CM | POA: Diagnosis not present

## 2022-10-29 DIAGNOSIS — R972 Elevated prostate specific antigen [PSA]: Secondary | ICD-10-CM | POA: Diagnosis not present

## 2022-10-29 DIAGNOSIS — N210 Calculus in bladder: Secondary | ICD-10-CM | POA: Diagnosis not present

## 2022-11-12 DIAGNOSIS — N401 Enlarged prostate with lower urinary tract symptoms: Secondary | ICD-10-CM | POA: Diagnosis not present

## 2022-11-12 DIAGNOSIS — D075 Carcinoma in situ of prostate: Secondary | ICD-10-CM | POA: Diagnosis not present

## 2022-11-12 DIAGNOSIS — C61 Malignant neoplasm of prostate: Secondary | ICD-10-CM | POA: Diagnosis not present

## 2022-11-12 DIAGNOSIS — R972 Elevated prostate specific antigen [PSA]: Secondary | ICD-10-CM | POA: Diagnosis not present

## 2022-12-08 DIAGNOSIS — L821 Other seborrheic keratosis: Secondary | ICD-10-CM | POA: Diagnosis not present

## 2022-12-08 DIAGNOSIS — L812 Freckles: Secondary | ICD-10-CM | POA: Diagnosis not present

## 2022-12-08 DIAGNOSIS — L570 Actinic keratosis: Secondary | ICD-10-CM | POA: Diagnosis not present

## 2022-12-08 DIAGNOSIS — Z85820 Personal history of malignant melanoma of skin: Secondary | ICD-10-CM | POA: Diagnosis not present

## 2022-12-08 DIAGNOSIS — Z85828 Personal history of other malignant neoplasm of skin: Secondary | ICD-10-CM | POA: Diagnosis not present

## 2022-12-10 DIAGNOSIS — C61 Malignant neoplasm of prostate: Secondary | ICD-10-CM | POA: Diagnosis not present

## 2022-12-11 ENCOUNTER — Other Ambulatory Visit (HOSPITAL_COMMUNITY): Payer: Self-pay | Admitting: Urology

## 2022-12-11 DIAGNOSIS — N138 Other obstructive and reflux uropathy: Secondary | ICD-10-CM

## 2022-12-11 DIAGNOSIS — C61 Malignant neoplasm of prostate: Secondary | ICD-10-CM

## 2022-12-22 ENCOUNTER — Encounter (HOSPITAL_COMMUNITY)
Admission: RE | Admit: 2022-12-22 | Discharge: 2022-12-22 | Disposition: A | Discharge status: Home / Self Care | Payer: Medicare Other | Source: Ambulatory Visit | Attending: Urology | Admitting: Urology

## 2022-12-22 DIAGNOSIS — R918 Other nonspecific abnormal finding of lung field: Secondary | ICD-10-CM | POA: Diagnosis not present

## 2022-12-22 DIAGNOSIS — C61 Malignant neoplasm of prostate: Secondary | ICD-10-CM | POA: Insufficient documentation

## 2022-12-22 DIAGNOSIS — N403 Nodular prostate with lower urinary tract symptoms: Secondary | ICD-10-CM | POA: Insufficient documentation

## 2022-12-22 DIAGNOSIS — N138 Other obstructive and reflux uropathy: Secondary | ICD-10-CM | POA: Diagnosis not present

## 2022-12-22 MED ORDER — FLOTUFOLASTAT F 18 GALLIUM 296-5846 MBQ/ML IV SOLN
7.5200 | Freq: Once | INTRAVENOUS | Status: AC
  Administered 2022-12-22: 7.52 via INTRAVENOUS

## 2023-01-07 DIAGNOSIS — C7802 Secondary malignant neoplasm of left lung: Secondary | ICD-10-CM | POA: Diagnosis not present

## 2023-01-07 DIAGNOSIS — N130 Hydronephrosis with ureteropelvic junction obstruction: Secondary | ICD-10-CM | POA: Diagnosis not present

## 2023-01-07 DIAGNOSIS — C61 Malignant neoplasm of prostate: Secondary | ICD-10-CM | POA: Diagnosis not present

## 2023-01-07 DIAGNOSIS — N210 Calculus in bladder: Secondary | ICD-10-CM | POA: Diagnosis not present

## 2023-01-07 DIAGNOSIS — N201 Calculus of ureter: Secondary | ICD-10-CM | POA: Diagnosis not present

## 2023-01-07 DIAGNOSIS — C7801 Secondary malignant neoplasm of right lung: Secondary | ICD-10-CM | POA: Diagnosis not present

## 2023-01-19 ENCOUNTER — Other Ambulatory Visit: Payer: Self-pay | Admitting: Urology

## 2023-02-05 DIAGNOSIS — C61 Malignant neoplasm of prostate: Secondary | ICD-10-CM | POA: Diagnosis not present

## 2023-02-08 NOTE — Progress Notes (Addendum)
 Anesthesia Review:  PCP: Ardell Manly  Cardiologist : none  Chest x-ray : EKG : 02/09/23  Echo : Stress test: Cardiac Cath :  Activity level: can do a flight of stairs without difficutly  Sleep Study/ CPAP : none  Fasting Blood Sugar :      / Checks Blood Sugar -- times a day:   Blood Thinner/ Instructions /Last Dose: ASA / Instructions/ Last Dose :    81 mg aspirin     PT with hx of prostate cancer.  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  PT has no difficulty breathing per pt.     Hx of TIA with no deficits.

## 2023-02-08 NOTE — Patient Instructions (Signed)
SURGICAL WAITING ROOM VISITATION  Patients having surgery or a procedure may have no more than 2 support people in the waiting area - these visitors may rotate.    Children under the age of 38 must have an adult with them who is not the patient.  Due to an increase in RSV and influenza rates and associated hospitalizations, children ages 64 and under may not visit patients in [facility] hospitals.  Visitors with respiratory illnesses are discouraged from visiting and should remain at home.  If the patient needs to stay at the hospital during part of their recovery, the visitor guidelines for inpatient rooms apply. Pre-op nurse will coordinate an appropriate time for 1 support person to accompany patient in pre-op.  This support person may not rotate.    Please refer to the [facility] website for the visitor guidelines for Inpatients (after your surgery is over and you are in a regular room).       Your procedure is scheduled on:  02/15/2023    Report to [facility] Main Entrance    Report to admitting at   [phone]   Call this number if you have problems the morning of surgery [phone]   Do not eat food or drink liquids  :After Midnight.                    If you have questions, please contact your surgeon's office.      Oral Hygiene is also important to reduce your risk of infection.                                    Remember - BRUSH YOUR TEETH THE MORNING OF SURGERY WITH YOUR REGULAR TOOTHPASTE  DENTURES WILL BE REMOVED PRIOR TO SURGERY PLEASE DO NOT APPLY "Poly grip" OR ADHESIVES!!!   Do NOT smoke after Midnight   Stop all vitamins and herbal supplements 7 days before surgery.   Take these medicines the morning of surgery with A SIP OF WATER:  none   DO NOT TAKE ANY ORAL DIABETIC MEDICATIONS DAY OF YOUR SURGERY  Bring CPAP mask and tubing day of surgery.                              You may not have any metal on your body including hair pins,  jewelry, and body piercing             Do not wear make-up, lotions, powders, perfumes/cologne, or deodorant  Do not wear nail polish including gel and S&S, artificial/acrylic nails, or any other type of covering on natural nails including finger and toenails. If you have artificial nails, gel coating, etc. that needs to be removed by a nail salon please have this removed prior to surgery or surgery may need to be canceled/ delayed if the surgeon/ anesthesia feels like they are unable to be safely monitored.   Do not shave  48 hours prior to surgery.               Men may shave face and neck.   Do not bring valuables to the hospital. [facility] IS NOT             RESPONSIBLE   FOR VALUABLES.   Contacts, glasses, dentures or bridgework may not be worn into surgery.   Bring small overnight  bag day of surgery.   DO NOT BRING YOUR HOME MEDICATIONS TO THE HOSPITAL. PHARMACY WILL DISPENSE MEDICATIONS LISTED ON YOUR MEDICATION LIST TO YOU DURING YOUR ADMISSION IN THE HOSPITAL!    Patients discharged on the day of surgery will not be allowed to drive home.  Someone NEEDS to stay with you for the first 24 hours after anesthesia.   Special Instructions: Bring a copy of your healthcare power of attorney and living will documents the day of surgery if you haven't scanned them before.              Please read over the following fact sheets you were given: IF YOU HAVE QUESTIONS ABOUT YOUR PRE-OP INSTRUCTIONS PLEASE CALL [phone]   If you received a COVID test during your pre-op visit  it is requested that you wear a mask when out in public, stay away from anyone that may not be feeling well and notify your surgeon if you develop symptoms. If you test positive for Covid or have been in contact with anyone that has tested positive in the last 10 days please notify you surgeon.    [facility] - Preparing for Surgery Before surgery, you can play an important role.  Because skin is not sterile, your  skin needs to be as free of germs as possible.  You can reduce the number of germs on your skin by washing with CHG (chlorahexidine gluconate) soap before surgery.  CHG is an antiseptic cleaner which kills germs and bonds with the skin to continue killing germs even after washing. Please DO NOT use if you have an allergy to CHG or antibacterial soaps.  If your skin becomes reddened/irritated stop using the CHG and inform your nurse when you arrive at Short Stay. Do not shave (including legs and underarms) for at least 48 hours prior to the first CHG shower.  You may shave your face/neck. Please follow these instructions carefully:  1.  Shower with CHG Soap the night before surgery and the  morning of Surgery.  2.  If you choose to wash your hair, wash your hair first as usual with your  normal  shampoo.  3.  After you shampoo, rinse your hair and body thoroughly to remove the  shampoo.                           4.  Use CHG as you would any other liquid soap.  You can apply chg directly  to the skin and wash                       Gently with a scrungie or clean washcloth.  5.  Apply the CHG Soap to your body ONLY FROM THE NECK DOWN.   Do not use on face/ open                           Wound or open sores. Avoid contact with eyes, ears mouth and genitals (private parts).                       Wash face,  Genitals (private parts) with your normal soap.             6.  Wash thoroughly, paying special attention to the area where your surgery  will be performed.  7.  Thoroughly rinse your body with warm water  from the neck down.  8.  DO NOT shower/wash with your normal soap after using and rinsing off  the CHG Soap.                9.  Pat yourself dry with a clean towel.            10.  Wear clean pajamas.            11.  Place clean sheets on your bed the night of your first shower and do not  sleep with pets. Day of Surgery : Do not apply any lotions/deodorants the morning of surgery.  Please wear  clean clothes to the hospital/surgery center.  FAILURE TO FOLLOW THESE INSTRUCTIONS MAY RESULT IN THE CANCELLATION OF YOUR SURGERY PATIENT SIGNATURE_________________________________  NURSE SIGNATURE__________________________________  ________________________________________________________________________

## 2023-02-09 ENCOUNTER — Other Ambulatory Visit: Payer: Self-pay

## 2023-02-09 ENCOUNTER — Encounter (HOSPITAL_COMMUNITY): Payer: Self-pay

## 2023-02-09 ENCOUNTER — Encounter (HOSPITAL_COMMUNITY)
Admission: RE | Admit: 2023-02-09 | Discharge: 2023-02-09 | Disposition: A | Discharge status: Home / Self Care | Payer: Medicare Other | Source: Ambulatory Visit | Attending: Urology | Admitting: Urology

## 2023-02-09 VITALS — BP 131/77 | HR 70 | Temp 97.8°F | Resp 16 | Ht 70.0 in | Wt 190.0 lb

## 2023-02-09 DIAGNOSIS — Z01818 Encounter for other preprocedural examination: Secondary | ICD-10-CM | POA: Insufficient documentation

## 2023-02-09 LAB — CBC
HCT: 44.5 % (ref 39.0–52.0)
Hemoglobin: 14.8 g/dL (ref 13.0–17.0)
MCH: 31.1 pg (ref 26.0–34.0)
MCHC: 33.3 g/dL (ref 30.0–36.0)
MCV: 93.5 fL (ref 80.0–100.0)
Platelets: 222 10*3/uL (ref 150–400)
RBC: 4.76 MIL/uL (ref 4.22–5.81)
RDW: 11.9 % (ref 11.5–15.5)
WBC: 7.1 10*3/uL (ref 4.0–10.5)
nRBC: 0 % (ref 0.0–0.2)

## 2023-02-09 LAB — BASIC METABOLIC PANEL
Anion gap: 11 (ref 5–15)
BUN: 20 mg/dL (ref 8–23)
CO2: 24 mmol/L (ref 22–32)
Calcium: 9.2 mg/dL (ref 8.9–10.3)
Chloride: 105 mmol/L (ref 98–111)
Creatinine, Ser: 1 mg/dL (ref 0.61–1.24)
GFR, Estimated: >60 mL/min (ref >60)
Glucose, Bld: 96 mg/dL (ref 70–99)
Potassium: 4 mmol/L (ref 3.5–5.1)
Sodium: 140 mmol/L (ref 135–145)

## 2023-02-14 NOTE — Anesthesia Preprocedure Evaluation (Addendum)
 Anesthesia Evaluation  Patient identified by MRN, date of birth, ID band Patient awake    Reviewed: Allergy & Precautions, NPO status , Patient's Chart, lab work & pertinent test results  History of Anesthesia Complications Negative for: history of anesthetic complications  Airway Mallampati: III  TM Distance: >3 FB Neck ROM: Full    Dental no notable dental hx. (+) Dental Advisory Given, Teeth Intact   Pulmonary former smoker   Pulmonary exam normal breath sounds clear to auscultation       Cardiovascular negative cardio ROS Normal cardiovascular exam Rhythm:Regular Rate:Normal     Neuro/Psych TIACVA, No Residual Symptoms  negative psych ROS   GI/Hepatic negative GI ROS, Neg liver ROS,,,  Endo/Other  negative endocrine ROS    Renal/GU BLADDER STONE, RIGHT URETERAL STONE  negative genitourinary   Musculoskeletal negative musculoskeletal ROS (+)    Abdominal   Peds  Hematology negative hematology ROS (+)   Anesthesia Other Findings Hx prostate and lung ca  Reproductive/Obstetrics                             Anesthesia Physical Anesthesia Plan  ASA: 3  Anesthesia Plan: General   Post-op Pain Management: Tylenol  PO (pre-op)*   Induction: Intravenous  PONV Risk Score and Plan: 2 and Ondansetron , Dexamethasone  and Treatment may vary due to age or medical condition  Airway Management Planned: Oral ETT  Additional Equipment: None  Intra-op Plan:   Post-operative Plan: Extubation in OR  Informed Consent:   Plan Discussed with:   Anesthesia Plan Comments:        Anesthesia Quick Evaluation

## 2023-02-15 ENCOUNTER — Other Ambulatory Visit: Payer: Self-pay | Admitting: Nurse Practitioner

## 2023-02-15 ENCOUNTER — Ambulatory Visit (HOSPITAL_BASED_OUTPATIENT_CLINIC_OR_DEPARTMENT_OTHER): Payer: Self-pay | Admitting: Anesthesiology

## 2023-02-15 ENCOUNTER — Encounter (HOSPITAL_COMMUNITY): Payer: Self-pay | Admitting: Urology

## 2023-02-15 ENCOUNTER — Ambulatory Visit (HOSPITAL_COMMUNITY): Payer: Medicare Other | Admitting: Physician Assistant

## 2023-02-15 ENCOUNTER — Other Ambulatory Visit: Payer: Self-pay

## 2023-02-15 ENCOUNTER — Encounter (HOSPITAL_COMMUNITY)
Admission: RE | Disposition: A | Discharge status: Home / Self Care | Payer: Self-pay | Source: Home / Self Care | Attending: Urology

## 2023-02-15 ENCOUNTER — Ambulatory Visit (HOSPITAL_COMMUNITY): Payer: Medicare Other

## 2023-02-15 ENCOUNTER — Observation Stay (HOSPITAL_COMMUNITY)
Admission: RE | Admit: 2023-02-15 | Discharge: 2023-02-16 | Disposition: A | Discharge status: Home / Self Care | Payer: Medicare Other | Attending: Urology | Admitting: Urology

## 2023-02-15 DIAGNOSIS — Z8673 Personal history of transient ischemic attack (TIA), and cerebral infarction without residual deficits: Secondary | ICD-10-CM | POA: Diagnosis not present

## 2023-02-15 DIAGNOSIS — N201 Calculus of ureter: Secondary | ICD-10-CM | POA: Diagnosis not present

## 2023-02-15 DIAGNOSIS — Z85828 Personal history of other malignant neoplasm of skin: Secondary | ICD-10-CM | POA: Diagnosis not present

## 2023-02-15 DIAGNOSIS — Z7982 Long term (current) use of aspirin: Secondary | ICD-10-CM | POA: Insufficient documentation

## 2023-02-15 DIAGNOSIS — Z87891 Personal history of nicotine dependence: Secondary | ICD-10-CM | POA: Diagnosis not present

## 2023-02-15 DIAGNOSIS — N210 Calculus in bladder: Secondary | ICD-10-CM

## 2023-02-15 DIAGNOSIS — Z79899 Other long term (current) drug therapy: Secondary | ICD-10-CM | POA: Insufficient documentation

## 2023-02-15 DIAGNOSIS — G459 Transient cerebral ischemic attack, unspecified: Secondary | ICD-10-CM

## 2023-02-15 DIAGNOSIS — E785 Hyperlipidemia, unspecified: Secondary | ICD-10-CM

## 2023-02-15 DIAGNOSIS — Z01818 Encounter for other preprocedural examination: Secondary | ICD-10-CM

## 2023-02-15 DIAGNOSIS — N132 Hydronephrosis with renal and ureteral calculous obstruction: Secondary | ICD-10-CM | POA: Insufficient documentation

## 2023-02-15 HISTORY — PX: CYSTOSCOPY/URETEROSCOPY/HOLMIUM LASER/STENT PLACEMENT: SHX6546

## 2023-02-15 SURGERY — CYSTOSCOPY/URETEROSCOPY/HOLMIUM LASER/STENT PLACEMENT
Anesthesia: General | Laterality: Right

## 2023-02-15 MED ORDER — ATORVASTATIN CALCIUM 40 MG PO TABS
40.0000 mg | ORAL_TABLET | Freq: Every evening | ORAL | Status: DC
  Administered 2023-02-15: 40 mg via ORAL
  Filled 2023-02-15: qty 1

## 2023-02-15 MED ORDER — LIDOCAINE 2% (20 MG/ML) 5 ML SYRINGE
INTRAMUSCULAR | Status: DC | PRN
  Administered 2023-02-15: 80 mg via INTRAVENOUS

## 2023-02-15 MED ORDER — RELUGOLIX 120 MG PO TABS
120.0000 mg | ORAL_TABLET | Freq: Every evening | ORAL | Status: DC
Start: 2023-02-15 — End: 2023-02-15

## 2023-02-15 MED ORDER — ACETAMINOPHEN 500 MG PO TABS
1000.0000 mg | ORAL_TABLET | Freq: Once | ORAL | Status: AC
  Administered 2023-02-15: 1000 mg via ORAL
  Filled 2023-02-15: qty 2

## 2023-02-15 MED ORDER — SUGAMMADEX SODIUM 200 MG/2ML IV SOLN
INTRAVENOUS | Status: DC | PRN
  Administered 2023-02-15: 200 mg via INTRAVENOUS

## 2023-02-15 MED ORDER — RELUGOLIX 120 MG PO TABS
240.0000 mg | ORAL_TABLET | Freq: Every evening | ORAL | Status: DC
  Administered 2023-02-15: 240 mg via ORAL
  Filled 2023-02-15: qty 2

## 2023-02-15 MED ORDER — MORPHINE SULFATE (PF) 2 MG/ML IV SOLN
2.0000 mg | INTRAVENOUS | Status: DC | PRN
  Administered 2023-02-15: 2 mg via INTRAVENOUS

## 2023-02-15 MED ORDER — FENTANYL CITRATE (PF) 100 MCG/2ML IJ SOLN
INTRAMUSCULAR | Status: AC
  Filled 2023-02-15: qty 2

## 2023-02-15 MED ORDER — ZOLPIDEM TARTRATE 5 MG PO TABS: 5.0000 mg | ORAL_TABLET | Freq: Every evening | ORAL | Status: DC | PRN

## 2023-02-15 MED ORDER — DEXAMETHASONE SODIUM PHOSPHATE 10 MG/ML IJ SOLN
INTRAMUSCULAR | Status: AC
  Filled 2023-02-15: qty 1

## 2023-02-15 MED ORDER — TRIPLE ANTIBIOTIC 3.5-400-5000 EX OINT: 1.0000 | TOPICAL_OINTMENT | Freq: Three times a day (TID) | CUTANEOUS | Status: DC | PRN

## 2023-02-15 MED ORDER — LACTATED RINGERS IV SOLN: INTRAVENOUS | Status: DC

## 2023-02-15 MED ORDER — DIPHENHYDRAMINE HCL 50 MG/ML IJ SOLN: 12.5000 mg | Freq: Four times a day (QID) | INTRAMUSCULAR | Status: DC | PRN

## 2023-02-15 MED ORDER — FENTANYL CITRATE (PF) 100 MCG/2ML IJ SOLN
INTRAMUSCULAR | Status: DC | PRN
  Administered 2023-02-15: 25 ug via INTRAVENOUS
  Administered 2023-02-15 (×2): 50 ug via INTRAVENOUS
  Administered 2023-02-15 (×3): 25 ug via INTRAVENOUS

## 2023-02-15 MED ORDER — SODIUM CHLORIDE (PF) 0.9 % IJ SOLN
INTRAMUSCULAR | Status: AC
  Filled 2023-02-15: qty 50

## 2023-02-15 MED ORDER — LIDOCAINE HCL (PF) 2 % IJ SOLN
INTRAMUSCULAR | Status: AC
  Filled 2023-02-15: qty 5

## 2023-02-15 MED ORDER — SODIUM CHLORIDE 0.9 % IR SOLN
Status: DC | PRN
  Administered 2023-02-15: 3000 mL via INTRAVESICAL

## 2023-02-15 MED ORDER — PHENYLEPHRINE 80 MCG/ML (10ML) SYRINGE FOR IV PUSH (FOR BLOOD PRESSURE SUPPORT)
PREFILLED_SYRINGE | INTRAVENOUS | Status: AC
  Filled 2023-02-15: qty 10

## 2023-02-15 MED ORDER — MORPHINE SULFATE (PF) 2 MG/ML IV SOLN
INTRAVENOUS | Status: AC
  Filled 2023-02-15: qty 1

## 2023-02-15 MED ORDER — OXYCODONE HCL 5 MG PO TABS
ORAL_TABLET | ORAL | Status: AC
  Administered 2023-02-15: 5 mg via ORAL
  Filled 2023-02-15: qty 1

## 2023-02-15 MED ORDER — APALUTAMIDE 60 MG PO TABS
240.0000 mg | ORAL_TABLET | Freq: Every evening | ORAL | Status: DC
  Administered 2023-02-15: 240 mg via ORAL
  Filled 2023-02-15 (×2): qty 4

## 2023-02-15 MED ORDER — ROCURONIUM BROMIDE 10 MG/ML (PF) SYRINGE
PREFILLED_SYRINGE | INTRAVENOUS | Status: AC
  Filled 2023-02-15: qty 10

## 2023-02-15 MED ORDER — EPHEDRINE SULFATE (PRESSORS) 50 MG/ML IJ SOLN
INTRAMUSCULAR | Status: DC | PRN
  Administered 2023-02-15: 5 mg via INTRAVENOUS
  Administered 2023-02-15: 10 mg via INTRAVENOUS

## 2023-02-15 MED ORDER — CHLORHEXIDINE GLUCONATE 0.12 % MT SOLN
15.0000 mL | Freq: Once | OROMUCOSAL | Status: AC
  Administered 2023-02-15: 15 mL via OROMUCOSAL

## 2023-02-15 MED ORDER — CEFAZOLIN SODIUM-DEXTROSE 2-4 GM/100ML-% IV SOLN
2.0000 g | INTRAVENOUS | Status: AC
  Administered 2023-02-15: 2 g via INTRAVENOUS
  Filled 2023-02-15: qty 100

## 2023-02-15 MED ORDER — FENTANYL CITRATE PF 50 MCG/ML IJ SOSY
PREFILLED_SYRINGE | INTRAMUSCULAR | Status: AC
  Filled 2023-02-15: qty 1

## 2023-02-15 MED ORDER — FENTANYL CITRATE PF 50 MCG/ML IJ SOSY
25.0000 ug | PREFILLED_SYRINGE | INTRAMUSCULAR | Status: DC | PRN
  Administered 2023-02-15: 50 ug via INTRAVENOUS

## 2023-02-15 MED ORDER — ACETAMINOPHEN 325 MG PO TABS: 650.0000 mg | ORAL_TABLET | ORAL | Status: DC | PRN

## 2023-02-15 MED ORDER — ONDANSETRON HCL 4 MG/2ML IJ SOLN
INTRAMUSCULAR | Status: DC | PRN
  Administered 2023-02-15: 4 mg via INTRAVENOUS

## 2023-02-15 MED ORDER — PROPOFOL 10 MG/ML IV BOLUS
INTRAVENOUS | Status: DC | PRN
  Administered 2023-02-15: 120 mg via INTRAVENOUS

## 2023-02-15 MED ORDER — EPHEDRINE 5 MG/ML INJ
INTRAVENOUS | Status: AC
  Filled 2023-02-15: qty 5

## 2023-02-15 MED ORDER — DOCUSATE SODIUM 100 MG PO CAPS
100.0000 mg | ORAL_CAPSULE | Freq: Two times a day (BID) | ORAL | Status: DC
  Administered 2023-02-15 – 2023-02-16 (×2): 100 mg via ORAL
  Filled 2023-02-15 (×2): qty 1

## 2023-02-15 MED ORDER — IOHEXOL 300 MG/ML  SOLN
INTRAMUSCULAR | Status: DC | PRN
  Administered 2023-02-15: 10 mL

## 2023-02-15 MED ORDER — BUPIVACAINE LIPOSOME 1.3 % IJ SUSP
INTRAMUSCULAR | Status: AC
  Filled 2023-02-15: qty 20

## 2023-02-15 MED ORDER — OXYBUTYNIN CHLORIDE ER 5 MG PO TB24
10.0000 mg | ORAL_TABLET | Freq: Every day | ORAL | Status: DC
  Administered 2023-02-15 – 2023-02-16 (×2): 10 mg via ORAL
  Filled 2023-02-15 (×2): qty 2

## 2023-02-15 MED ORDER — ONDANSETRON HCL 4 MG/2ML IJ SOLN: 4.0000 mg | INTRAMUSCULAR | Status: DC | PRN

## 2023-02-15 MED ORDER — OXYCODONE HCL 5 MG PO TABS
5.0000 mg | ORAL_TABLET | ORAL | Status: DC | PRN
  Administered 2023-02-15 – 2023-02-16 (×2): 5 mg via ORAL
  Filled 2023-02-15 (×2): qty 1

## 2023-02-15 MED ORDER — SENNA 8.6 MG PO TABS
1.0000 | ORAL_TABLET | Freq: Two times a day (BID) | ORAL | Status: DC
  Administered 2023-02-15 – 2023-02-16 (×2): 8.6 mg via ORAL
  Filled 2023-02-15 (×2): qty 1

## 2023-02-15 MED ORDER — CEFAZOLIN SODIUM-DEXTROSE 2-4 GM/100ML-% IV SOLN
2.0000 g | Freq: Three times a day (TID) | INTRAVENOUS | Status: AC
  Administered 2023-02-15 – 2023-02-16 (×2): 2 g via INTRAVENOUS
  Filled 2023-02-15 (×2): qty 100

## 2023-02-15 MED ORDER — ONDANSETRON HCL 4 MG/2ML IJ SOLN
INTRAMUSCULAR | Status: AC
  Filled 2023-02-15: qty 2

## 2023-02-15 MED ORDER — STERILE WATER FOR IRRIGATION IR SOLN
Status: DC | PRN
  Administered 2023-02-15: 500 mL

## 2023-02-15 MED ORDER — PROPOFOL 10 MG/ML IV BOLUS
INTRAVENOUS | Status: AC
  Filled 2023-02-15: qty 20

## 2023-02-15 MED ORDER — 0.9 % SODIUM CHLORIDE (POUR BTL) OPTIME
TOPICAL | Status: DC | PRN
  Administered 2023-02-15: 1000 mL

## 2023-02-15 MED ORDER — ROCURONIUM BROMIDE 100 MG/10ML IV SOLN
INTRAVENOUS | Status: DC | PRN
Start: 2023-02-15 — End: 2023-02-15
  Administered 2023-02-15: 40 mg via INTRAVENOUS
  Administered 2023-02-15: 20 mg via INTRAVENOUS

## 2023-02-15 MED ORDER — DEXAMETHASONE SODIUM PHOSPHATE 10 MG/ML IJ SOLN
INTRAMUSCULAR | Status: DC | PRN
  Administered 2023-02-15: 10 mg via INTRAVENOUS

## 2023-02-15 MED ORDER — DIPHENHYDRAMINE HCL 12.5 MG/5ML PO ELIX: 12.5000 mg | ORAL_SOLUTION | Freq: Four times a day (QID) | ORAL | Status: DC | PRN

## 2023-02-15 MED ORDER — ORAL CARE MOUTH RINSE: 15.0000 mL | Freq: Once | OROMUCOSAL | Status: AC

## 2023-02-15 MED ORDER — PHENYLEPHRINE 80 MCG/ML (10ML) SYRINGE FOR IV PUSH (FOR BLOOD PRESSURE SUPPORT)
PREFILLED_SYRINGE | INTRAVENOUS | Status: DC | PRN
Start: 2023-02-15 — End: 2023-02-15
  Administered 2023-02-15: 160 ug via INTRAVENOUS
  Administered 2023-02-15 (×3): 80 ug via INTRAVENOUS

## 2023-02-15 SURGICAL SUPPLY — 35 items
BAG URINE DRAIN 2000ML AR STRL (UROLOGICAL SUPPLIES) IMPLANT
BAG URO CATCHER STRL LF (MISCELLANEOUS) ×2 IMPLANT
BASKET LASER NITINOL 1.9FR (BASKET) IMPLANT
BASKET ZERO TIP NITINOL 2.4FR (BASKET) IMPLANT
CATH URETERAL DUAL LUMEN 10F (MISCELLANEOUS) IMPLANT
CATH URETL OPEN END 6FR 70 (CATHETERS) ×2 IMPLANT
CLOTH BEACON ORANGE TIMEOUT ST (SAFETY) ×2 IMPLANT
DRAIN CHANNEL 15F RND FF 3/16 (WOUND CARE) IMPLANT
DRAPE C-ARM 42X120 X-RAY (DRAPES) IMPLANT
DRAPE INCISE IOBAN 66X45 STRL (DRAPES) IMPLANT
DRSG TEGADERM 4X4.75 (GAUZE/BANDAGES/DRESSINGS) IMPLANT
EVACUATOR SILICONE 100CC (DRAIN) IMPLANT
EXTRACTOR STONE 1.7FRX115CM (UROLOGICAL SUPPLIES) IMPLANT
GAUZE SPONGE 2X2 8PLY STRL LF (GAUZE/BANDAGES/DRESSINGS) IMPLANT
GLOVE BIO SURGEON STRL SZ7.5 (GLOVE) ×2 IMPLANT
GOWN STRL REUS W/ TWL XL LVL3 (GOWN DISPOSABLE) ×2 IMPLANT
GUIDEWIRE ANG ZIPWIRE 038X150 (WIRE) IMPLANT
GUIDEWIRE STR DUAL SENSOR (WIRE) ×2 IMPLANT
HOLDER FOLEY CATH W/STRAP (MISCELLANEOUS) IMPLANT
KIT TURNOVER KIT A (KITS) IMPLANT
LASER FIB FLEXIVA PULSE ID 365 (Laser) IMPLANT
MANIFOLD NEPTUNE II (INSTRUMENTS) ×2 IMPLANT
PACK CYSTO (CUSTOM PROCEDURE TRAY) ×2 IMPLANT
PACK GENERAL/GYN (CUSTOM PROCEDURE TRAY) IMPLANT
SHEATH NAVIGATOR HD 11/13X28 (SHEATH) IMPLANT
SHEATH NAVIGATOR HD 11/13X36 (SHEATH) IMPLANT
SHEET LAVH (DRAPES) IMPLANT
STENT URET 6FRX26 CONTOUR (STENTS) IMPLANT
SUT CHROMIC 3 0 SH 27 (SUTURE) IMPLANT
SUT ETHILON 3 0 PS 1 (SUTURE) IMPLANT
SUT PDS AB 1 TP1 96 (SUTURE) IMPLANT
SUT VIC AB 2-0 SH 27X BRD (SUTURE) IMPLANT
TRACTIP FLEXIVA PULS ID 200XHI (Laser) IMPLANT
TUBING CONNECTING 10 (TUBING) ×2 IMPLANT
TUBING UROLOGY SET (TUBING) ×2 IMPLANT

## 2023-02-15 NOTE — Anesthesia Procedure Notes (Signed)
 Procedure Name: Intubation Date/Time: 02/15/2023 9:49 AM  Performed by: Mertie Abt, CRNAPre-anesthesia Checklist: Patient identified, Patient being monitored, Timeout performed, Emergency Drugs available and Suction available Patient Re-evaluated:Patient Re-evaluated prior to induction Oxygen Delivery Method: Circle system utilized Preoxygenation: Pre-oxygenation with 100% oxygen Induction Type: IV induction Ventilation: Mask ventilation without difficulty Laryngoscope Size: Mac and 4 Grade View: Grade II Tube type: Oral Tube size: 7.5 mm Number of attempts: 1 Airway Equipment and Method: Stylet Placement Confirmation: ETT inserted through vocal cords under direct vision, positive ETCO2 and breath sounds checked- equal and bilateral Secured at: 22 cm Tube secured with: Tape Dental Injury: Teeth and Oropharynx as per pre-operative assessment

## 2023-02-15 NOTE — Transfer of Care (Signed)
 Immediate Anesthesia Transfer of Care Note  Patient: David Allen  Procedure(s) Performed: CYSTOSCOPY/RIGHT URETEROSCOPY/HOLMIUM LASER/STENT PLACEMENT AND OPEN CYSTOLITHOTOMY (Right)  Patient Location: PACU  Anesthesia Type:General  Level of Consciousness: awake, alert , and oriented  Airway & Oxygen Therapy: Patient Spontanous Breathing and Patient connected to face mask oxygen  Post-op Assessment: Report given to RN and Post -op Vital signs reviewed and stable  Post vital signs: Reviewed and stable  Last Vitals:  Vitals Value Taken Time  BP 165/115 02/15/23 1210  Temp    Pulse 38 02/15/23 1213  Resp 14 02/15/23 1213  SpO2 100 % 02/15/23 1213  Vitals shown include unfiled device data.  Last Pain:  Vitals:   02/15/23 0648  TempSrc:   PainSc: 0-No pain         Complications: No notable events documented.

## 2023-02-15 NOTE — H&P (Addendum)
 CC/HPI: CC: High risk prostate cancer  HPI:  09/10/2022  Patient referred back for elevated PSA. I had seen him in the past for elevated PSA which normalized to 3.63. However, most recent PSA was 24. He denies any significant voiding complaints. Has mild weak stream but not bothersome. Denies any hematuria or dysuria.   10/29/2022  Patient underwent an MRI of the prostate that revealed a PI-RADS 5 lesion in the right peripheral zone with likely involvement of anterior fibromuscular stroma in the mid gland with 1.5 cm or more of capsular contact and bulge suspicious for transscapular spread. Mildly enlarged 0.7 cm periprosthetic lymph node. Also incidentally noted to have a 6.7 cm bladder calculus.   12/10/2022  Patient underwent an MRI of the prostate that revealed a 50 g prostate with a large PI-RADS 5 lesion in the right peripheral zone from base to the apex with likely involvement of the anterior fibromuscular stroma and suspicious transscapular spread. PSA was 24.8. Underwent an MRI fusion biopsy and revealed a prostate size of 45 g. Unfortunately, pathology was positive for Gleason 4+4 in the region of interest. On standard 12 core biopsy, 6 out of 12 cores were positive for prostate cancer all of which were located on the right with a maximum Gleason score 4+5. Has clinical T3 disease based on imaging.   01/07/2023  Patient underwent a PSMA PET scan that revealed multiple small pulmonary nodules with associated radiotracer activity consistent with prostate cancer pulmonary metastasis. No metastatic adenopathy in the pelvis or retroperitoneum. CT scan confirmed large bladder stone with right hydronephrosis and right ureteral calculi. Reviewing the scan, hydronephrosis goes down to the bladder and may actually be secondary to locally advanced prostate cancer rather than a ureteral stone.  02/15/2023 Patient presents today for cystoscopy with right ureteroscopy with laser lithotripsy and ureteral stent  placement and open cystolithotomy.    ALLERGIES: None   MEDICATIONS: Aspirin  325 mg tablet  Atorvastatin  Calcium   Orgovyx  120 mg tablet 1 tablet PO Daily  Vitamin B-12     GU PSH: Prostate Needle Biopsy - 11/12/2022     NON-GU PSH: Appendectomy (laparoscopic) Hernia Repair Knee Arthroscopy/surgery Rotator Cuff Surgery.. Surgical Pathology, Gross And Microscopic Examination For Prostate Needle - 11/12/2022 Visit Complexity (formerly [facility]) - 12/10/2022, 10/29/2022, 09/10/2022     GU PMH: Prostate Cancer - 12/10/2022 Prostate nodule w/ LUTS - 12/10/2022, - 11/12/2022, - 10/29/2022, - 09/10/2022 Elevated PSA - 11/12/2022, - 10/29/2022, - 09/10/2022, - 2019 Prostate, Neoplasm of uncertain behavior - 11/12/2022, - 10/29/2022 Bladder Stone - 10/29/2022 Weak Urinary Stream (Stable) - 09/10/2022, - 2019 BPH w/LUTS - 2019 Nocturia - 2019    NON-GU PMH: Hypercholesterolemia Stroke/TIA    FAMILY HISTORY: 2 sons - Other Dementia - Mother Lung Cancer - Father   SOCIAL HISTORY: Marital Status: Married Preferred Language: English; Race: White Current Smoking Status: Patient does not smoke anymore.   Tobacco Use Assessment Completed: Used Tobacco in last 30 days? Drinks 1 caffeinated drink per day.    REVIEW OF SYSTEMS:    GU Review Male:   Patient denies frequent urination, hard to postpone urination, burning/ pain with urination, get up at night to urinate, leakage of urine, stream starts and stops, trouble starting your stream, have to strain to urinate , erection problems, and penile pain.  Gastrointestinal (Upper):   Patient denies nausea, vomiting, and indigestion/ heartburn.  Gastrointestinal (Lower):   Patient denies diarrhea and constipation.  Constitutional:   Patient denies fever, night sweats, weight loss,  and fatigue.  Skin:   Patient denies skin rash/ lesion and itching.  Eyes:   Patient denies blurred vision and double vision.  Ears/ Nose/ Throat:   Patient denies sore throat  and sinus problems.  Hematologic/Lymphatic:   Patient denies swollen glands and easy bruising.  Cardiovascular:   Patient denies leg swelling and chest pains.  Respiratory:   Patient denies cough and shortness of breath.  Endocrine:   Patient denies excessive thirst.  Musculoskeletal:   Patient denies back pain and joint pain.  Neurological:   Patient denies dizziness and headaches.  Psychologic:   Patient denies depression and anxiety.   BP (!) 158/66   Pulse 84   Temp 99.2 F (37.3 C) (Oral)   Resp 16   Ht 5\' 10"  (1.778 m)   Wt 86.2 kg   SpO2 98%   BMI 27.26 kg/m    MULTI-SYSTEM PHYSICAL EXAMINATION:    Constitutional: Well-nourished. No physical deformities. Normally developed. Good grooming.  Gastrointestinal: No mass, no tenderness, no rigidity, non obese abdomen.  Eyes: Normal conjunctivae. Normal eyelids.  Musculoskeletal: Normal gait and station of head and neck.     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 Will perform cystoscopy with right ureteroscopy with laser lithotripsy and ureteral stent placement. Risk benefits discussed.   He has a large bladder stone, and therefore I recommend open cystolithotomy. Risk benefits discussed including bleeding, infection, injury to surrounding structures, need for additional procedures.   CC: Dr. Husain

## 2023-02-15 NOTE — Plan of Care (Signed)
  Problem: Education: Goal: Knowledge of General Education information will improve Description: Including pain rating scale, medication(s)/side effects and non-pharmacologic comfort measures Outcome: Progressing   Problem: Clinical Measurements: Goal: Diagnostic test results will improve Outcome: Progressing   Problem: Pain Managment: Goal: General experience of comfort will improve and/or be controlled Outcome: Progressing

## 2023-02-15 NOTE — Discharge Instructions (Addendum)
Activity:  You are encouraged to ambulate frequently (about every hour during waking hours) to help prevent blood clots from forming in your legs or lungs.  However, you should not engage in any heavy lifting (> 10-15 lbs), strenuous activity, or straining.   Diet: You should advance your diet as instructed by your physician.  It will be normal to have some bloating, nausea, and abdominal discomfort intermittently.   Prescriptions:  You will be provided a prescription for pain medication to take as needed.  If your pain is not severe enough to require the prescription pain medication, you may take extra strength Tylenol instead which will have less side effects.  You should also take a prescribed stool softener to avoid straining with bowel movements as the prescription pain medication may constipate you.   Incisions: You may remove your dressing bandages 48 hours after surgery if not removed in the hospital.  You will either have some small staples or special tissue glue at each of the incision sites. Once the bandages are removed (if present), the incisions may stay open to air.  You may start showering (but not soaking or bathing in water) the 2nd day after surgery and the incisions simply need to be patted dry after the shower.  No additional care is needed.   What to call us about: You should call the office [phone]) if you develop fever > 101 or develop persistent vomiting. Activity:  You are encouraged to ambulate frequently (about every hour during waking hours) to help prevent blood clots from forming in your legs or lungs.  However, you should not engage in any heavy lifting (> 10-15 lbs), strenuous activity, or straining.

## 2023-02-15 NOTE — Anesthesia Postprocedure Evaluation (Signed)
 Anesthesia Post Note  Patient: David Allen  Procedure(s) Performed: CYSTOSCOPY/RIGHT URETEROSCOPY/HOLMIUM LASER/STENT PLACEMENT AND OPEN CYSTOLITHOTOMY (Right)     Patient location during evaluation: PACU Anesthesia Type: General Level of consciousness: awake and alert Pain management: pain level controlled Vital Signs Assessment: post-procedure vital signs reviewed and stable Respiratory status: spontaneous breathing, nonlabored ventilation, respiratory function stable and patient connected to nasal cannula oxygen Cardiovascular status: blood pressure returned to baseline and stable Postop Assessment: no apparent nausea or vomiting Anesthetic complications: no  No notable events documented.  Last Vitals:  Vitals:   02/15/23 1500 02/15/23 1530  BP: (!) 160/87 (!) 155/76  Pulse: 81 83  Resp: 17 15  Temp:    SpO2: 99% 100%    Last Pain:  Vitals:   02/15/23 1500  TempSrc:   PainSc: 6                  Taylin Mans L Lucile Didonato

## 2023-02-15 NOTE — Op Note (Signed)
 Operative Note  Preoperative diagnosis:  1.  Right ureteral calculi 2.  Bladder calculus  Postoperative diagnosis: 1.  Same  Procedure(s): 1.  Cystoscopy with right retrograde pyelogram, right ureteroscopy with stone extraction, ureteral stent placement 2.  Open cystolitholapaxy  Surgeon: Leila Punt, MD  Assistants: Robbert Childes, MD  Anesthesia: General  Complications: None immediate  EBL: 30 cc  Specimens: 1.  Bladder calculus  Drains/Catheters: 1.  6 x 26 double-J ureteral stent on the right 2.  JP drain 3.  20 French Foley catheter  Intraoperative findings: 1.  Normal anterior urethra 2.  Borderline to moderately obstructing prostate with mild protrusion of median lobe. 3.  Bladder mucosa without any tumors or masses.  Very large bladder calculus.  Separate smaller bladder calculus. 4.  Right retrograde pyelogram revealed a tortuous distal right ureter with J hooking.  Ultimately able to navigate a wire up and straighten up the ureter to perform ureteroscopy.  Ureteroscopy showed multiple small stones that were basket extracted.  Ureter was cleared of stone.  5.  Right retrograde pyelogram revealed a tortuous right ureter.  Multiple filling defects in the distal ureter consistent with small stones which were later confirmed with ureteroscopy.  Also revealed severe hydroureteronephrosis.  Indication: 78 year old male with metastatic prostate cancer found on imaging to have distal right ureteral calculi and a massive bladder calculus.  He presents for the previously mentioned operations.  Description of procedure:  The patient was identified and consent was obtained.  The patient was taken to the operating room and placed in the supine position.  The patient was placed under general anesthesia.  Perioperative antibiotics were administered.  The patient was placed in dorsal lithotomy.  Patient was prepped and draped in a standard sterile fashion and a timeout was  performed.  A 21 French rigid cystoscope was advanced into the urethra and into the bladder.  Complete cystoscopy was performed with findings noted above.  The right ureter was cannulated with a sensor wire.  I have difficult time navigating the wire up and an open-ended ureteral catheter was inserted into the distal ureter.  Retrograde pyelogram was performed that showed severe J hooking and tortuous ureter.  I was able to navigate a Glidewire up the ureter followed by advancement of the open-ended ureteral catheter over the Glidewire.  Glidewire was withdrawn and then a sensor wire was readvanced through the open-ended ureteral catheter and into the kidney.  Open-ended ureteral catheter was withdrawn.  This allowed the ureter to straighten out.  I advanced a semirigid ureteroscope alongside the wire into the distal ureter and multiple small stones were encountered which were able to be basket extracted.  The entire ureter was cleared of clinically significant stone fragments.  I advanced the scope up to the renal pelvis with no other clinically significant stone seen.  I shot a retrograde pyelogram through the scope that showed severe hydroureteronephrosis.  I withdrew the scope visualizing the ureter upon removal and again did not see any clinically significant stones large enough to basket and there was no significant ureteral injury.  There was some edema at the ureteral orifice/UVJ from navigating the scope in and out multiple times but no injury.  I backloaded the wire onto rigid cystoscope and advanced that into the bladder followed by routine placement of a 6 x 26 double-J ureteral stent.  Fluoroscopy confirmed proximal placement and direct visualization confirmed a good coil within the bladder.  I drained the bladder and remove the small bladder  stones that were evacuated.  20 French Foley catheter was placed.  All members of the surgical team rescrubbed.  Abdomen was already prepped and draped.  Lower  midline abdominal incision was made from the pubic bone just below the umbilicus.  It was carried down sharply through the subcutaneous tissue followed by with Bovie electrocautery.  Anterior rectus sheath was entered and divided with Bovie electrocautery.  Musculature was bluntly divided in the midline.  Balfour retractor was used to provide retraction.  Retropubic space was bluntly developed.  Bladder was exposed.  I filled the bladder with normal saline and put a stay stitch on each side of the bladder.  The bladder was then incised at the midline with Bovie electrocautery and extended.  The bladder stone was identified and removed.  Several other smaller stones were removed as well.  Bladder was then stone free.  Stent was visualized and was not displaced.  There was no active bleeding noted within the bladder that was significant.  The bladder was then closed in 2 layers first closing the mucosal layer with a running 3-0 chromic followed by closure of the detrusor layer with running 2-0 Vicryl.  Bladder was filled with normal saline and there was no leak.  JP drain was brought out the right side of the abdomen.  This was carried down with a nylon stitch.  We then closed the fascia with running 0 looped PDS suture.  The subcutaneous fat was brought together with a running 2-0 Vicryl and the skin was closed with staples.  Exparel  was instilled for anesthetic effect.  Dressing was applied.  This concluded the operation.  Patient tolerated the procedure well was stable postoperative.  Plan: Keep overnight for observation.  Likely discharge home tomorrow with Foley catheter for approximately 10 days.  He will need a cystogram prior to removal.  Stent can be removed at that time as well.

## 2023-02-16 ENCOUNTER — Encounter (HOSPITAL_COMMUNITY): Payer: Self-pay | Admitting: Urology

## 2023-02-16 DIAGNOSIS — Z7982 Long term (current) use of aspirin: Secondary | ICD-10-CM | POA: Diagnosis not present

## 2023-02-16 DIAGNOSIS — Z85828 Personal history of other malignant neoplasm of skin: Secondary | ICD-10-CM | POA: Diagnosis not present

## 2023-02-16 DIAGNOSIS — N132 Hydronephrosis with renal and ureteral calculous obstruction: Secondary | ICD-10-CM | POA: Diagnosis not present

## 2023-02-16 DIAGNOSIS — Z8673 Personal history of transient ischemic attack (TIA), and cerebral infarction without residual deficits: Secondary | ICD-10-CM | POA: Diagnosis not present

## 2023-02-16 DIAGNOSIS — Z87891 Personal history of nicotine dependence: Secondary | ICD-10-CM | POA: Diagnosis not present

## 2023-02-16 DIAGNOSIS — N210 Calculus in bladder: Secondary | ICD-10-CM | POA: Diagnosis not present

## 2023-02-16 MED ORDER — DOCUSATE SODIUM 100 MG PO CAPS
100.0000 mg | ORAL_CAPSULE | Freq: Every day | ORAL | 0 refills | Status: AC | PRN
Start: 2023-02-16 — End: 2023-02-23

## 2023-02-16 MED ORDER — OXYCODONE HCL 5 MG PO TABS: 5.0000 mg | ORAL_TABLET | ORAL | 0 refills | Status: AC | PRN

## 2023-02-16 MED ORDER — ACETAMINOPHEN 325 MG PO TABS: 1000.0000 mg | ORAL_TABLET | Freq: Four times a day (QID) | ORAL | 0 refills | Status: AC

## 2023-02-16 MED ORDER — SENNA 8.6 MG PO TABS: 1.0000 | ORAL_TABLET | Freq: Every day | ORAL | 0 refills | Status: AC

## 2023-02-16 NOTE — TOC Transition Note (Signed)
Transition of Care [facility]) - Discharge Note   Patient Details  Name: David Allen MRN: [id number] Date of Birth: [birth year]  Transition of [facility]) CM/SW Contact:  Lanier Clam, RN Phone Number: 02/16/2023, 9:15 AM   Clinical Narrative: D/c home no CM needs.      Final next level of care: Home/Self Care Barriers to Discharge: No Barriers Identified   Patient Goals and CMS Choice Patient states their goals for this hospitalization and ongoing recovery are:: Home CMS Medicare.gov Compare Post Acute Care list provided to:: Patient Choice offered to / list presented to : Patient      Discharge Placement                       Discharge Plan and Services Additional resources added to the After Visit Summary for                                       Social Drivers of Health (SDOH) Interventions SDOH Screenings   Food Insecurity: No Food Insecurity (02/15/2023)  Housing: Low Risk  (02/15/2023)  Transportation Needs: No Transportation Needs (02/15/2023)  Utilities: Not At Risk (02/15/2023)  Social Connections: Socially Integrated (02/15/2023)  Tobacco Use: Medium Risk (02/15/2023)     Readmission Risk Interventions     No data to display

## 2023-02-16 NOTE — Discharge Summary (Signed)
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 Briefly, David Allen is a 78 y.o. year old patient who was admitted with Mx Pca along, presenting with right ureteral calculi and bladder calculus.   Hospital Course: Pt admitted and underwent open cystolithomy along with right URS and stone removal on 02/15/23. Their hospital course was unremarkable. By POD1, they were tolerating a regular diet, voiding , pain was controlled with oral medications, and they were deemed appropriate for discharge. JP drain was removed prior to discharge. They will go home with foley catheter until follow-up appointment (for foley removal + staple removal)  Their course was complicated by: None   On the day of discharge, the patient was tolerating a regular diet and their pain was well controlled. They were determined to be stable for discharge home  Laboratory values: No results for input(s): "HGB", "HCT" in the last 72 hours. No results for input(s): "CREATININE" in the last 72 hours.  Disposition: Home  Discharge medications:  Allergies as of 02/16/2023   No Known Allergies      Medication List     TAKE these medications     acetaminophen 325 MG tablet Commonly known as: TYLENOL Take 3 tablets (975 mg total) by mouth every 6 (six) hours for 7 days.   aspirin EC 81 MG tablet Take 81 mg by mouth every evening. Swallow whole.   atorvastatin 40 MG tablet Commonly known as: LIPITOR Take 1 tablet (40 mg total) by mouth daily. -- Office visit needed for further refills What changed: when to take this   cyanocobalamin 1000 MCG/ML injection Commonly known as: VITAMIN B12 Inject 1,000 mcg into the muscle every 30 (thirty) days.   docusate sodium 100 MG capsule Commonly known as: Colace Take 1 capsule (100 mg total) by mouth daily as needed for up to 7 days.   Erleada 60 MG tablet Generic drug: apalutamide Take 240 mg by mouth daily.   Orgovyx 120 MG tablet Generic drug: relugolix Take 240 mg by mouth every evening. He was instructed to increase this medication to 2 (240mg ) tablets PO daily on 02/05/23.   oxyCODONE 5 MG immediate release tablet Commonly known as: Oxy IR/ROXICODONE Take 1 tablet (5 mg total) by mouth every 4 (four) hours as needed for moderate pain (pain score 4-6).   senna 8.6 MG Tabs tablet Commonly known as: SENOKOT Take 1 tablet (8.6 mg total) by mouth daily for 7 days.        Followup: 02/25/2023 - Cystogram and catheter removal, staple removal

## 2023-02-17 DIAGNOSIS — N210 Calculus in bladder: Secondary | ICD-10-CM | POA: Diagnosis not present

## 2023-02-26 DIAGNOSIS — N201 Calculus of ureter: Secondary | ICD-10-CM | POA: Diagnosis not present

## 2023-03-09 DIAGNOSIS — R338 Other retention of urine: Secondary | ICD-10-CM | POA: Diagnosis not present

## 2023-03-10 DIAGNOSIS — L570 Actinic keratosis: Secondary | ICD-10-CM | POA: Diagnosis not present

## 2023-03-10 DIAGNOSIS — L821 Other seborrheic keratosis: Secondary | ICD-10-CM | POA: Diagnosis not present

## 2023-03-10 DIAGNOSIS — L812 Freckles: Secondary | ICD-10-CM | POA: Diagnosis not present

## 2023-03-10 DIAGNOSIS — Z85828 Personal history of other malignant neoplasm of skin: Secondary | ICD-10-CM | POA: Diagnosis not present

## 2023-03-10 DIAGNOSIS — Z85820 Personal history of malignant melanoma of skin: Secondary | ICD-10-CM | POA: Diagnosis not present

## 2023-03-17 DIAGNOSIS — R338 Other retention of urine: Secondary | ICD-10-CM | POA: Diagnosis not present

## 2023-03-22 DIAGNOSIS — N210 Calculus in bladder: Secondary | ICD-10-CM | POA: Diagnosis not present

## 2023-03-22 DIAGNOSIS — R338 Other retention of urine: Secondary | ICD-10-CM | POA: Diagnosis not present

## 2023-03-22 DIAGNOSIS — N201 Calculus of ureter: Secondary | ICD-10-CM | POA: Diagnosis not present

## 2023-03-24 DIAGNOSIS — C61 Malignant neoplasm of prostate: Secondary | ICD-10-CM | POA: Diagnosis not present

## 2023-03-27 DIAGNOSIS — Z23 Encounter for immunization: Secondary | ICD-10-CM | POA: Diagnosis not present

## 2023-03-31 ENCOUNTER — Other Ambulatory Visit: Payer: Self-pay | Admitting: Ophthalmology

## 2023-03-31 DIAGNOSIS — N400 Enlarged prostate without lower urinary tract symptoms: Secondary | ICD-10-CM | POA: Diagnosis not present

## 2023-03-31 DIAGNOSIS — R338 Other retention of urine: Secondary | ICD-10-CM | POA: Diagnosis not present

## 2023-03-31 DIAGNOSIS — N401 Enlarged prostate with lower urinary tract symptoms: Secondary | ICD-10-CM | POA: Diagnosis not present

## 2023-04-02 LAB — SURGICAL PATHOLOGY

## 2023-04-15 DIAGNOSIS — R5382 Chronic fatigue, unspecified: Secondary | ICD-10-CM | POA: Diagnosis not present

## 2023-04-15 DIAGNOSIS — R310 Gross hematuria: Secondary | ICD-10-CM | POA: Diagnosis not present

## 2023-04-15 DIAGNOSIS — C61 Malignant neoplasm of prostate: Secondary | ICD-10-CM | POA: Diagnosis not present

## 2023-04-19 DIAGNOSIS — G459 Transient cerebral ischemic attack, unspecified: Secondary | ICD-10-CM | POA: Diagnosis not present

## 2023-05-05 DIAGNOSIS — E7800 Pure hypercholesterolemia, unspecified: Secondary | ICD-10-CM | POA: Diagnosis not present

## 2023-05-05 DIAGNOSIS — G459 Transient cerebral ischemic attack, unspecified: Secondary | ICD-10-CM | POA: Diagnosis not present

## 2023-05-18 DIAGNOSIS — Z23 Encounter for immunization: Secondary | ICD-10-CM | POA: Diagnosis not present

## 2023-05-19 DIAGNOSIS — G459 Transient cerebral ischemic attack, unspecified: Secondary | ICD-10-CM | POA: Diagnosis not present

## 2023-06-05 DIAGNOSIS — E7800 Pure hypercholesterolemia, unspecified: Secondary | ICD-10-CM | POA: Diagnosis not present

## 2023-06-05 DIAGNOSIS — G459 Transient cerebral ischemic attack, unspecified: Secondary | ICD-10-CM | POA: Diagnosis not present

## 2023-06-07 DIAGNOSIS — Z85820 Personal history of malignant melanoma of skin: Secondary | ICD-10-CM | POA: Diagnosis not present

## 2023-06-07 DIAGNOSIS — Z85828 Personal history of other malignant neoplasm of skin: Secondary | ICD-10-CM | POA: Diagnosis not present

## 2023-06-07 DIAGNOSIS — L821 Other seborrheic keratosis: Secondary | ICD-10-CM | POA: Diagnosis not present

## 2023-06-07 DIAGNOSIS — L270 Generalized skin eruption due to drugs and medicaments taken internally: Secondary | ICD-10-CM | POA: Diagnosis not present

## 2023-06-07 DIAGNOSIS — L812 Freckles: Secondary | ICD-10-CM | POA: Diagnosis not present

## 2023-06-07 DIAGNOSIS — L309 Dermatitis, unspecified: Secondary | ICD-10-CM | POA: Diagnosis not present

## 2023-06-07 DIAGNOSIS — D485 Neoplasm of uncertain behavior of skin: Secondary | ICD-10-CM | POA: Diagnosis not present

## 2023-06-07 DIAGNOSIS — D0472 Carcinoma in situ of skin of left lower limb, including hip: Secondary | ICD-10-CM | POA: Diagnosis not present

## 2023-06-07 DIAGNOSIS — L570 Actinic keratosis: Secondary | ICD-10-CM | POA: Diagnosis not present

## 2023-06-18 DIAGNOSIS — G459 Transient cerebral ischemic attack, unspecified: Secondary | ICD-10-CM | POA: Diagnosis not present

## 2023-07-01 DIAGNOSIS — C61 Malignant neoplasm of prostate: Secondary | ICD-10-CM | POA: Diagnosis not present

## 2023-07-05 DIAGNOSIS — G459 Transient cerebral ischemic attack, unspecified: Secondary | ICD-10-CM | POA: Diagnosis not present

## 2023-07-05 DIAGNOSIS — E7800 Pure hypercholesterolemia, unspecified: Secondary | ICD-10-CM | POA: Diagnosis not present

## 2023-07-26 DIAGNOSIS — C61 Malignant neoplasm of prostate: Secondary | ICD-10-CM | POA: Diagnosis not present

## 2023-07-30 DIAGNOSIS — E538 Deficiency of other specified B group vitamins: Secondary | ICD-10-CM | POA: Diagnosis not present

## 2023-07-30 DIAGNOSIS — Z0000 Encounter for general adult medical examination without abnormal findings: Secondary | ICD-10-CM | POA: Diagnosis not present

## 2023-07-30 DIAGNOSIS — G459 Transient cerebral ischemic attack, unspecified: Secondary | ICD-10-CM | POA: Diagnosis not present

## 2023-07-30 DIAGNOSIS — G250 Essential tremor: Secondary | ICD-10-CM | POA: Diagnosis not present

## 2023-07-30 DIAGNOSIS — Z1389 Encounter for screening for other disorder: Secondary | ICD-10-CM | POA: Diagnosis not present

## 2023-07-30 DIAGNOSIS — C7800 Secondary malignant neoplasm of unspecified lung: Secondary | ICD-10-CM | POA: Diagnosis not present

## 2023-07-30 DIAGNOSIS — Z23 Encounter for immunization: Secondary | ICD-10-CM | POA: Diagnosis not present

## 2023-07-30 DIAGNOSIS — C61 Malignant neoplasm of prostate: Secondary | ICD-10-CM | POA: Diagnosis not present

## 2023-07-30 DIAGNOSIS — E7800 Pure hypercholesterolemia, unspecified: Secondary | ICD-10-CM | POA: Diagnosis not present

## 2023-07-30 DIAGNOSIS — Z85828 Personal history of other malignant neoplasm of skin: Secondary | ICD-10-CM | POA: Diagnosis not present

## 2023-08-05 DIAGNOSIS — E7800 Pure hypercholesterolemia, unspecified: Secondary | ICD-10-CM | POA: Diagnosis not present

## 2023-08-05 DIAGNOSIS — G459 Transient cerebral ischemic attack, unspecified: Secondary | ICD-10-CM | POA: Diagnosis not present

## 2023-09-01 DIAGNOSIS — G459 Transient cerebral ischemic attack, unspecified: Secondary | ICD-10-CM | POA: Diagnosis not present

## 2023-09-05 DIAGNOSIS — G459 Transient cerebral ischemic attack, unspecified: Secondary | ICD-10-CM | POA: Diagnosis not present

## 2023-09-05 DIAGNOSIS — E7800 Pure hypercholesterolemia, unspecified: Secondary | ICD-10-CM | POA: Diagnosis not present

## 2023-09-07 DIAGNOSIS — C44729 Squamous cell carcinoma of skin of left lower limb, including hip: Secondary | ICD-10-CM | POA: Diagnosis not present

## 2023-09-07 DIAGNOSIS — L239 Allergic contact dermatitis, unspecified cause: Secondary | ICD-10-CM | POA: Diagnosis not present

## 2023-09-07 DIAGNOSIS — Z85820 Personal history of malignant melanoma of skin: Secondary | ICD-10-CM | POA: Diagnosis not present

## 2023-09-07 DIAGNOSIS — D485 Neoplasm of uncertain behavior of skin: Secondary | ICD-10-CM | POA: Diagnosis not present

## 2023-09-07 DIAGNOSIS — Z85828 Personal history of other malignant neoplasm of skin: Secondary | ICD-10-CM | POA: Diagnosis not present

## 2023-09-07 DIAGNOSIS — D692 Other nonthrombocytopenic purpura: Secondary | ICD-10-CM | POA: Diagnosis not present

## 2023-09-07 DIAGNOSIS — L821 Other seborrheic keratosis: Secondary | ICD-10-CM | POA: Diagnosis not present

## 2023-09-07 DIAGNOSIS — L570 Actinic keratosis: Secondary | ICD-10-CM | POA: Diagnosis not present

## 2023-09-21 DIAGNOSIS — Z23 Encounter for immunization: Secondary | ICD-10-CM | POA: Diagnosis not present

## 2023-10-02 DIAGNOSIS — Z23 Encounter for immunization: Secondary | ICD-10-CM | POA: Diagnosis not present

## 2023-10-05 DIAGNOSIS — G459 Transient cerebral ischemic attack, unspecified: Secondary | ICD-10-CM | POA: Diagnosis not present

## 2023-10-05 DIAGNOSIS — E7800 Pure hypercholesterolemia, unspecified: Secondary | ICD-10-CM | POA: Diagnosis not present

## 2023-10-11 DIAGNOSIS — H26491 Other secondary cataract, right eye: Secondary | ICD-10-CM | POA: Diagnosis not present

## 2023-10-11 DIAGNOSIS — Z961 Presence of intraocular lens: Secondary | ICD-10-CM | POA: Diagnosis not present

## 2023-10-11 DIAGNOSIS — H02834 Dermatochalasis of left upper eyelid: Secondary | ICD-10-CM | POA: Diagnosis not present

## 2023-10-11 DIAGNOSIS — H43813 Vitreous degeneration, bilateral: Secondary | ICD-10-CM | POA: Diagnosis not present

## 2023-10-22 DIAGNOSIS — C61 Malignant neoplasm of prostate: Secondary | ICD-10-CM | POA: Diagnosis not present

## 2023-11-05 DIAGNOSIS — G459 Transient cerebral ischemic attack, unspecified: Secondary | ICD-10-CM | POA: Diagnosis not present

## 2023-11-05 DIAGNOSIS — E7800 Pure hypercholesterolemia, unspecified: Secondary | ICD-10-CM | POA: Diagnosis not present

## 2023-11-15 DIAGNOSIS — R059 Cough, unspecified: Secondary | ICD-10-CM | POA: Diagnosis not present

## 2023-11-18 DIAGNOSIS — C7800 Secondary malignant neoplasm of unspecified lung: Secondary | ICD-10-CM | POA: Diagnosis not present

## 2023-11-18 DIAGNOSIS — C61 Malignant neoplasm of prostate: Secondary | ICD-10-CM | POA: Diagnosis not present

## 2023-11-22 DIAGNOSIS — G459 Transient cerebral ischemic attack, unspecified: Secondary | ICD-10-CM | POA: Diagnosis not present

## 2023-12-07 DIAGNOSIS — Z85820 Personal history of malignant melanoma of skin: Secondary | ICD-10-CM | POA: Diagnosis not present

## 2023-12-07 DIAGNOSIS — D692 Other nonthrombocytopenic purpura: Secondary | ICD-10-CM | POA: Diagnosis not present

## 2023-12-07 DIAGNOSIS — L578 Other skin changes due to chronic exposure to nonionizing radiation: Secondary | ICD-10-CM | POA: Diagnosis not present

## 2023-12-07 DIAGNOSIS — L570 Actinic keratosis: Secondary | ICD-10-CM | POA: Diagnosis not present

## 2023-12-07 DIAGNOSIS — Z85828 Personal history of other malignant neoplasm of skin: Secondary | ICD-10-CM | POA: Diagnosis not present

## 2023-12-07 DIAGNOSIS — L821 Other seborrheic keratosis: Secondary | ICD-10-CM | POA: Diagnosis not present
